# Patient Record
Sex: Male | Born: 1940 | Race: White | Hispanic: No | Marital: Married
Health system: Northeastern US, Academic
[De-identification: ages and names within clinical notes are randomized; demographics above are authoritative.]

---

## 2017-01-04 LAB — CMP (EXT)
ALT/SGPT (EXT): 11 U/L (ref 10–55)
AST/SGOT (EXT): 13 U/L (ref 10–40)
Albumin (EXT): 4.7 g/dL (ref 3.3–5.0)
Alkaline Phosphatase (EXT): 51 U/L (ref 30–100)
Anion Gap (EXT): 11 mmol/L (ref 3–17)
BUN (EXT): 19 mg/dL (ref 8–25)
Bilirubin, Total (EXT): 1.1 mg/dL — ABNORMAL HIGH (ref 0.0–1.0)
CO2 (EXT): 25 mmol/L (ref 23–32)
CalciumCalcium (EXT): 9.7 mg/dL (ref 8.5–10.5)
Chloride (EXT): 100 mmol/L (ref 98–108)
Creatinine (EXT): 1.13 mg/dL (ref 0.60–1.50)
GFR Estimated (Calc) (EXT): 63 mL/min/{1.73_m2} (ref >59)
Globulin (EXT): 1.9 g/dL (ref 1.9–4.1)
Glucose (EXT): 106 mg/dL (ref 70–110)
Potassium (EXT): 4.8 mmol/L (ref 3.4–5.0)
Protein (EXT): 6.6 g/dL (ref 6.0–8.3)
Sodium (EXT): 136 mmol/L (ref 135–145)

## 2020-11-19 LAB — CMP (EXT)
ALT/SGPT (EXT): 10 U/L (ref <42)
AST/SGOT (EXT): 15 U/L (ref <41)
Albumin (EXT): 5.1 g/dL (ref 3.5–5.2)
Alkaline Phosphatase (EXT): 58 U/L (ref 40–129)
Anion Gap (EXT): 11 mmol/L (ref 7–17)
BUN (EXT): 16 mg/dL (ref 6–23)
Bilirubin, Total (EXT): 0.8 mg/dL (ref 0.2–1.2)
CO2 (EXT): 25 mmol/L (ref 22–31)
CalciumCalcium (EXT): 9.8 mg/dL (ref 8.8–10.7)
Chloride (EXT): 103 mmol/L (ref 98–107)
Creatinine (EXT): 1.23 mg/dL — ABNORMAL HIGH (ref 0.50–1.20)
GFR Estimated (Calc) (EXT): 59 mL/min/{1.73_m2} — ABNORMAL LOW (ref >59)
Globulin (EXT): 1.4 g/dL — ABNORMAL LOW (ref 2.3–4.2)
Glucose (EXT): 97 mg/dL (ref 70–100)
Potassium (EXT): 4.4 mmol/L (ref 3.4–5.1)
Protein (EXT): 6.5 g/dL (ref 6.4–8.3)
Sodium (EXT): 139 mmol/L (ref 136–145)

## 2021-02-08 LAB — CMP (EXT)
ALT/SGPT (EXT): 12 U/L (ref <42)
AST/SGOT (EXT): 17 U/L (ref <41)
Albumin (EXT): 4.7 g/dL (ref 3.5–5.2)
Alkaline Phosphatase (EXT): 70 U/L (ref 40–129)
Anion Gap (EXT): 10 mmol/L (ref 7–17)
BUN (EXT): 25 mg/dL — ABNORMAL HIGH (ref 6–23)
Bilirubin, Total (EXT): 0.7 mg/dL (ref 0.2–1.2)
CO2 (EXT): 26 mmol/L (ref 22–31)
CalciumCalcium (EXT): 10 mg/dL (ref 8.8–10.7)
Chloride (EXT): 103 mmol/L (ref 98–107)
Creatinine (EXT): 1.46 mg/dL — ABNORMAL HIGH (ref 0.50–1.20)
GFR Estimated (Calc) (EXT): 48 mL/min/{1.73_m2} — ABNORMAL LOW (ref >59)
Globulin (EXT): 1.5 g/dL — ABNORMAL LOW (ref 2.3–4.2)
Glucose (EXT): 101 mg/dL — ABNORMAL HIGH (ref 70–100)
Potassium (EXT): 4.2 mmol/L (ref 3.4–5.1)
Protein (EXT): 6.2 g/dL — ABNORMAL LOW (ref 6.4–8.3)
Sodium (EXT): 139 mmol/L (ref 136–145)

## 2021-03-20 IMAGING — MR MRI LUMBAR SPINE WITHOUT CONTRAST
4 of 6 series · 19 of 48 positions shown · IV contrast (gadolinium)
Comparison: None

________________________________________________________________________________________________ 
MRI LUMBAR SPINE WITHOUT CONTRAST, 03/20/2021 [DATE]: 
CLINICAL INDICATION: Fell one month ago, history of L1 vertebral fracture, low 
back pain
TECHNIQUE: Sagittal T1, Sagittal T2, Sagittal STIR, Axial T1 and Axial T2 MR 
images of the lumbar spine were performed without intravenous gadolinium 
enhancement.

[Series 101: survey · axial · 10.0mm · 1.39mm/px · z∈[-33,+201]mm · 6 of 10 slices shown]
[im 1/10]
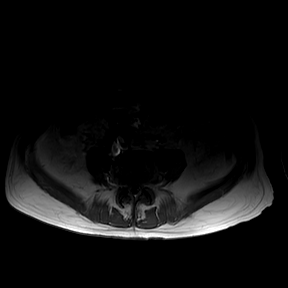
[im 2/10]
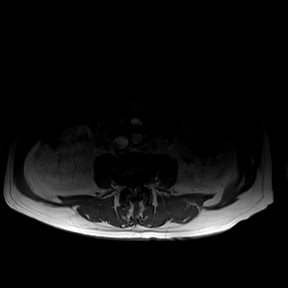
[im 4/10]
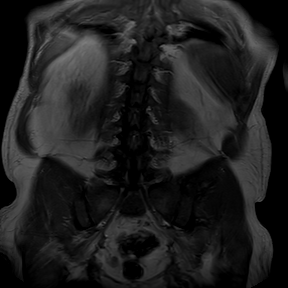
[im 6/10]
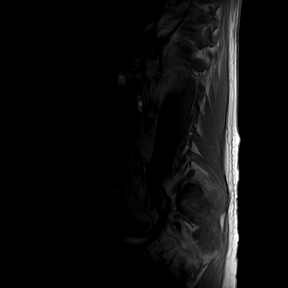
[im 8/10]
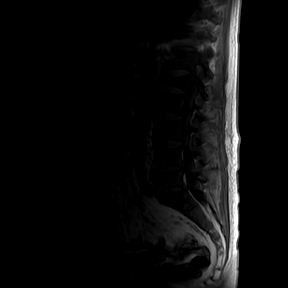
[im 10/10]
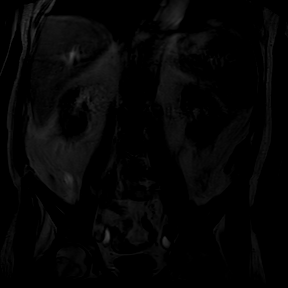

[Series 201: t2w_cor-surv · coronal · 6.0mm · 0.60mm/px · 3 of 5 slices shown]
[im 1/5]
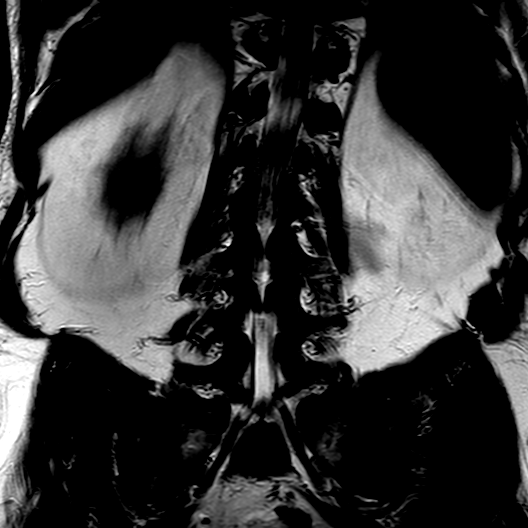
[im 3/5]
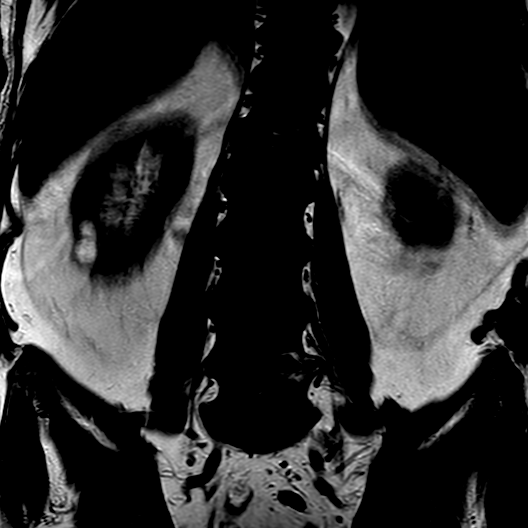
[im 5/5]
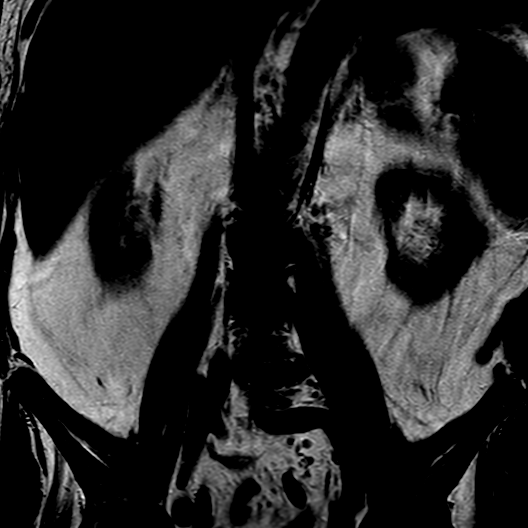

[Series 301: t1_tse_sag · sagittal · 4.0mm · 0.48mm/px · 7 of 17 slices shown]
[im 1/17]
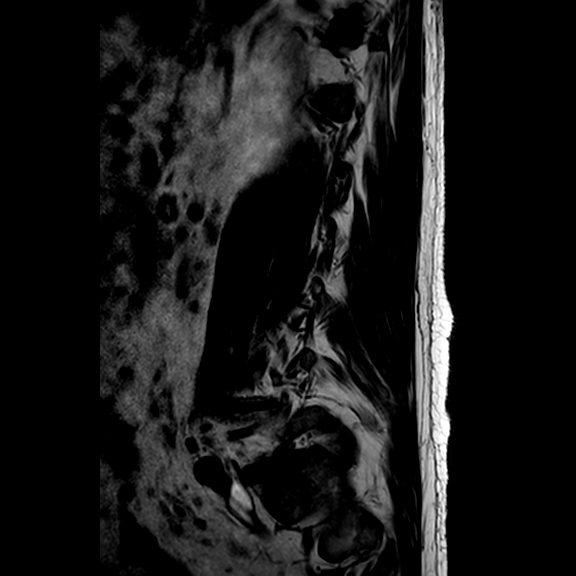
[im 3/17]
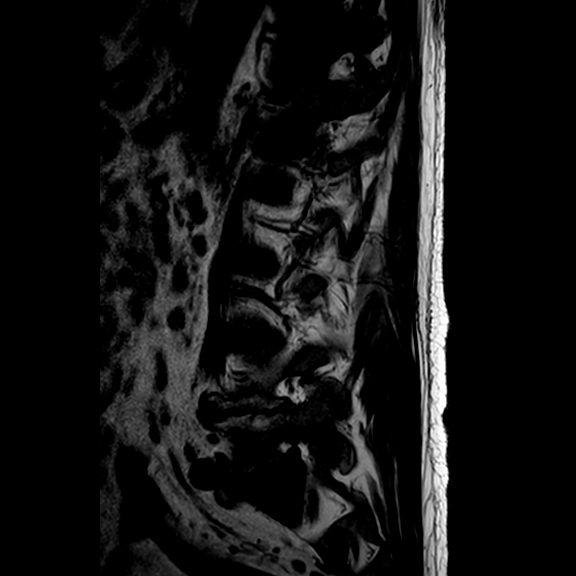
[im 5/17]
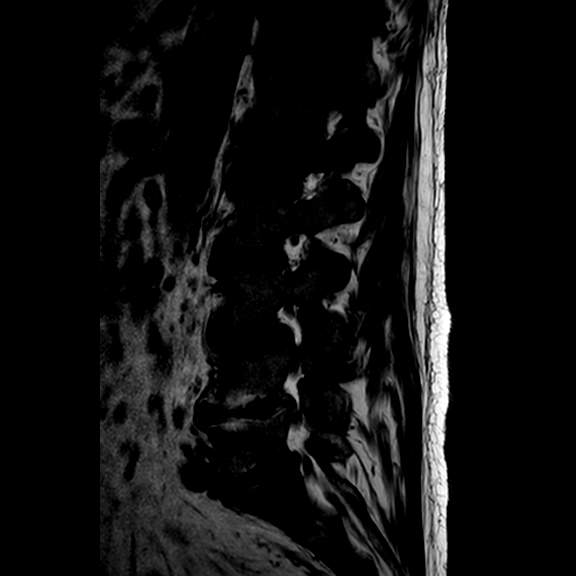
[im 7/17]
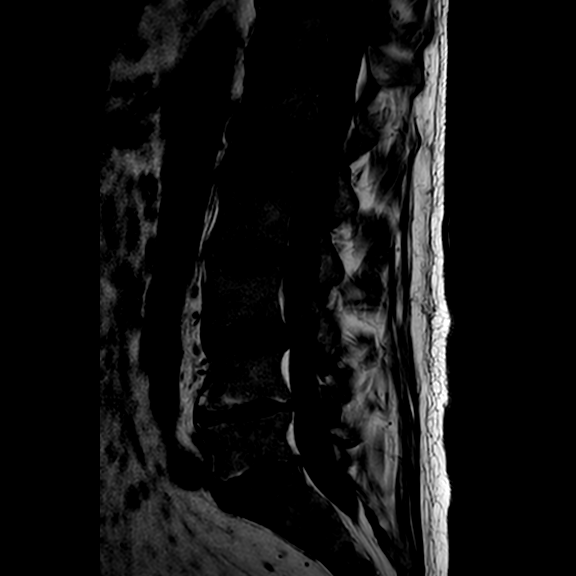
[im 10/17]
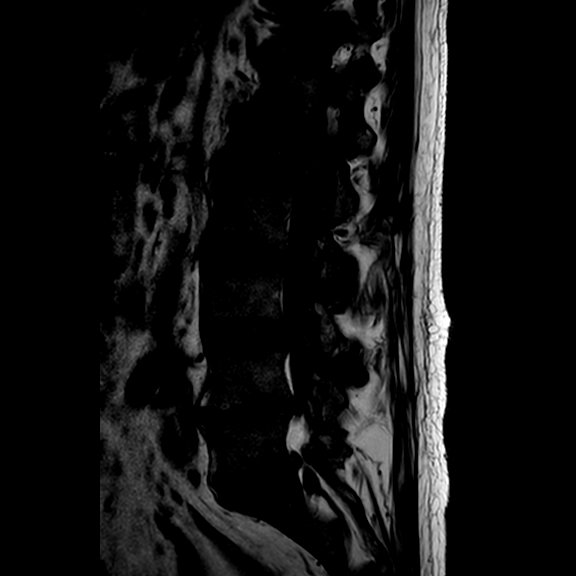
[im 12/17]
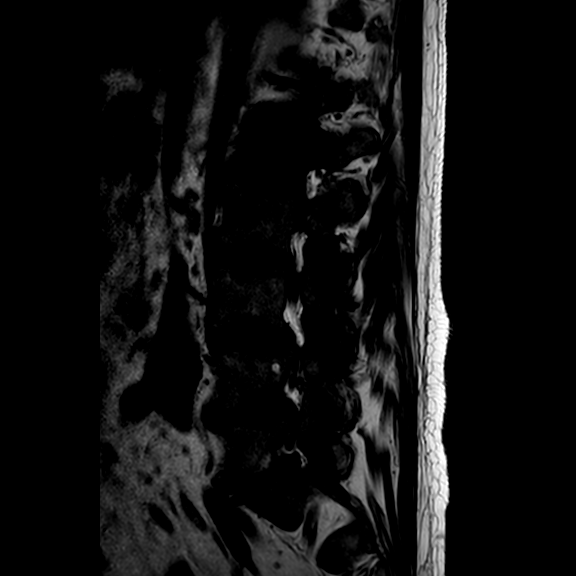
[im 14/17]
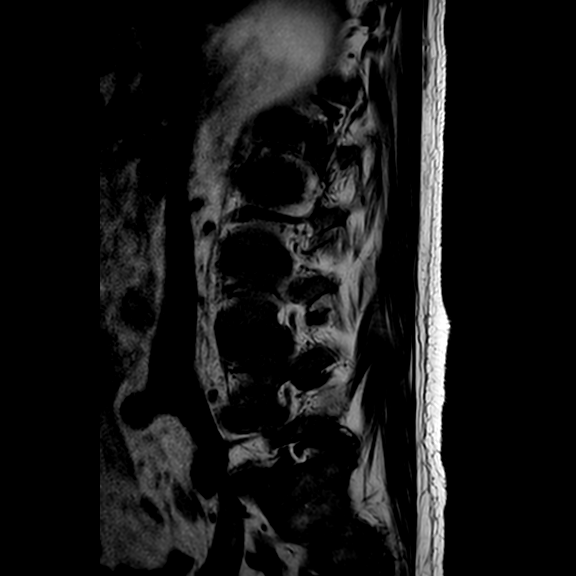

[Series 401: t2_tse_sag · sagittal · 4.0mm · 0.43mm/px · 3 of 17 slices shown]
[im 3/17]
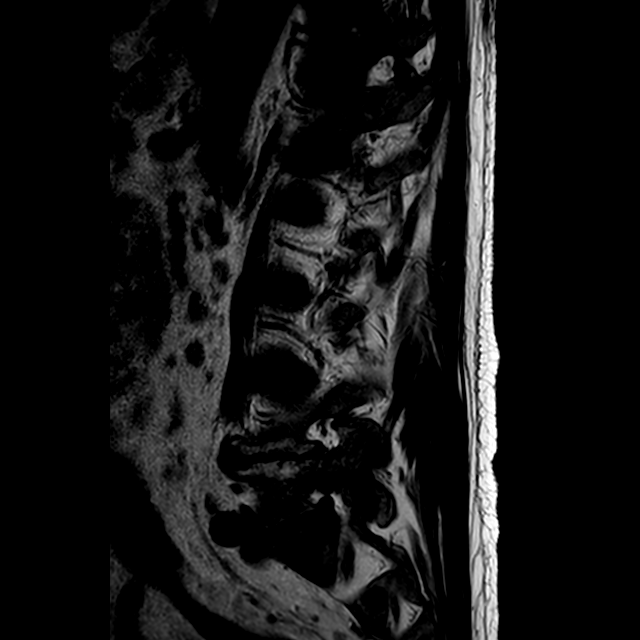
[im 10/17]
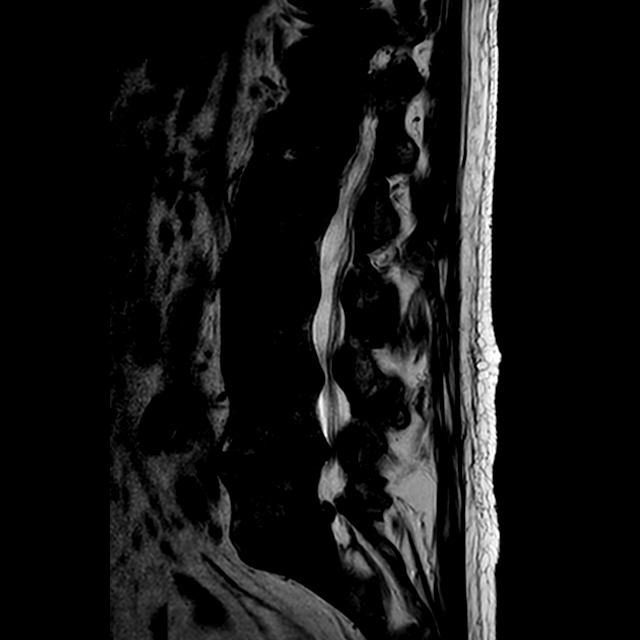
[im 14/17]
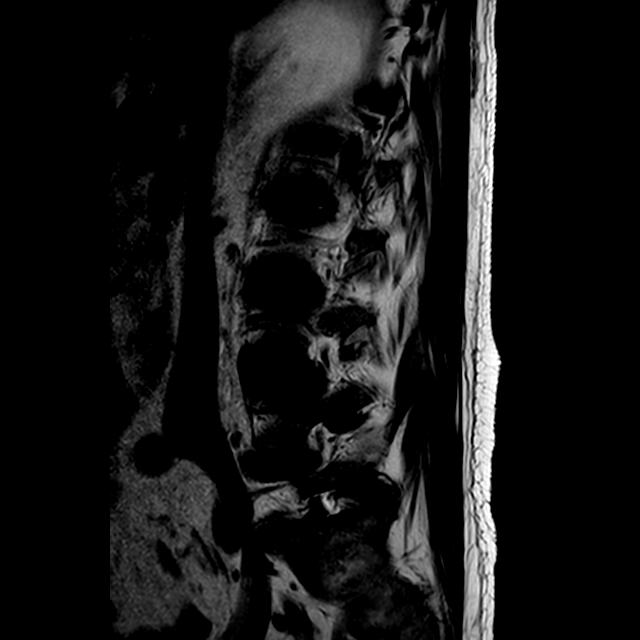

[19 of 48 positions shown; findings below may reference images not displayed]

FINDINGS: There is a moderate compression fracture of L1 with approximately 60% 
loss of height. There is mild retropulsion along the posterosuperior endplate 
indenting the thecal sac but not touching the conus tip. There appears to be 
mild residual edema suggesting relatively recent but nonacute onset, likely 1-2 
months in age. Correlation with onset of dictation. Advise. 
At L5-S1 there is marked narrowing along the posterior disc interspace. The 
canal is open. There is mild bilateral foraminal stenosis and moderate facet 
degenerative change. 
At L4-5 there is broad-based disc bulge and moderate facet change with 
ligamentous thickening contributes to mild canal stenosis. Foramina are open. 
At L3-4 there is 2 mm anterolisthesis. There is moderate canal stenosis. 
Moderate marked facet change with ligamentous thickening. Foramina are open. 
At L2-3 there is moderate to marked facet degenerative change. There is a 
synovial cyst on the left limited by the ligamentum flavum, 10 x 3 mm, axial T2 
image 18. This mildly deforms the left dorsal thecal sac, producing 
borderline-mild left-sided canal stenosis. Foramina are open. 
At L1-2 the canal and foramina are open. 
Conus terminates opposite T12-L1. There is mild lumbar dextrocurvature. There 
appear to be small right lower pole renal cysts, not well evaluated. Ultrasound 
would be useful to confirm the benign nature of these findings.
IMPRESSION: Relatively recent but nonacute moderate compression fracture of L1 as described. 
Degenerative changes elsewhere. There is moderate canal stenosis at L3-4, where 
there is 2 mm degenerative anterolisthesis. 
Left-sided synovial cyst at L2-3 mildly effaces the left dorsal thecal sac, 
producing borderline-mild left-sided canal stenosis.  
Right renal cysts are not completely evaluated. Renal ultrasound would be useful 
to confirm the benign nature of these findings.

## 2021-06-28 LAB — CMP (EXT)
ALT/SGPT (EXT): 9 U/L (ref <42)
AST/SGOT (EXT): 13 U/L (ref <41)
Albumin (EXT): 4.7 g/dL (ref 3.5–5.2)
Alkaline Phosphatase (EXT): 82 U/L (ref 40–129)
Anion Gap (EXT): 11 mmol/L (ref 7–17)
BUN (EXT): 23 mg/dL (ref 6–23)
Bilirubin, Total (EXT): 0.6 mg/dL (ref 0.2–1.2)
CO2 (EXT): 24 mmol/L (ref 22–31)
CalciumCalcium (EXT): 9.6 mg/dL (ref 8.8–10.7)
Chloride (EXT): 105 mmol/L (ref 98–107)
Creatinine (EXT): 1.23 mg/dL — ABNORMAL HIGH (ref 0.50–1.20)
GFR Estimated (Calc) (EXT): 59 mL/min/{1.73_m2} — ABNORMAL LOW (ref >59)
Globulin (EXT): 1.5 g/dL — ABNORMAL LOW (ref 2.3–4.2)
Glucose (EXT): 104 mg/dL — ABNORMAL HIGH (ref 70–100)
Potassium (EXT): 4.4 mmol/L (ref 3.4–5.1)
Protein (EXT): 6.2 g/dL — ABNORMAL LOW (ref 6.4–8.3)
Sodium (EXT): 140 mmol/L (ref 136–145)

## 2021-09-07 LAB — CMP (EXT)
ALT/SGPT (EXT): 9 U/L (ref <42)
AST/SGOT (EXT): 13 U/L (ref <41)
Albumin (EXT): 4.5 g/dL (ref 3.5–5.2)
Alkaline Phosphatase (EXT): 93 U/L (ref 40–129)
Anion Gap (EXT): 13 mmol/L (ref 7–17)
BUN (EXT): 17 mg/dL (ref 6–23)
Bilirubin, Total (EXT): 0.5 mg/dL (ref 0.2–1.2)
CO2 (EXT): 22 mmol/L (ref 22–31)
CalciumCalcium (EXT): 10 mg/dL (ref 8.8–10.7)
Chloride (EXT): 105 mmol/L (ref 98–107)
Creatinine (EXT): 1.31 mg/dL — ABNORMAL HIGH (ref 0.50–1.20)
GFR Estimated (Calc) (EXT): 55 mL/min/{1.73_m2} — ABNORMAL LOW (ref >59)
Globulin (EXT): 1.6 g/dL — ABNORMAL LOW (ref 2.3–4.2)
Glucose (EXT): 153 mg/dL — ABNORMAL HIGH (ref 70–100)
Potassium (EXT): 4 mmol/L (ref 3.4–5.1)
Protein (EXT): 6.1 g/dL — ABNORMAL LOW (ref 6.4–8.3)
Sodium (EXT): 140 mmol/L (ref 136–145)

## 2021-11-22 LAB — CMP (EXT)
ALT/SGPT (EXT): 8 U/L (ref <42)
AST/SGOT (EXT): 14 U/L (ref <41)
Albumin (EXT): 4.5 g/dL (ref 3.5–5.2)
Alkaline Phosphatase (EXT): 88 U/L (ref 40–129)
Anion Gap (EXT): 9 mmol/L (ref 7–17)
BUN (EXT): 22 mg/dL (ref 6–23)
Bilirubin, Total (EXT): 0.7 mg/dL (ref 0.2–1.2)
CO2 (EXT): 27 mmol/L (ref 22–31)
CalciumCalcium (EXT): 10.3 mg/dL (ref 8.8–10.7)
Chloride (EXT): 103 mmol/L (ref 98–107)
Creatinine (EXT): 1.44 mg/dL — ABNORMAL HIGH (ref 0.50–1.20)
GFR Estimated (Calc) (EXT): 49 mL/min/{1.73_m2} — ABNORMAL LOW (ref >59)
Globulin (EXT): 1.6 g/dL — ABNORMAL LOW (ref 2.3–4.2)
Glucose (EXT): 132 mg/dL — ABNORMAL HIGH (ref 70–100)
Potassium (EXT): 4 mmol/L (ref 3.4–5.1)
Protein (EXT): 6.1 g/dL — ABNORMAL LOW (ref 6.4–8.3)
Sodium (EXT): 139 mmol/L (ref 136–145)

## 2021-12-08 LAB — CMP (EXT)
ALT/SGPT (EXT): 8 U/L (ref <42)
AST/SGOT (EXT): 16 U/L (ref <41)
Albumin (EXT): 4.5 g/dL (ref 3.5–5.2)
Alkaline Phosphatase (EXT): 89 U/L (ref 40–129)
Anion Gap (EXT): 13 mmol/L (ref 7–17)
BUN (EXT): 22 mg/dL (ref 6–23)
Bilirubin, Total (EXT): 0.7 mg/dL (ref 0.2–1.2)
CO2 (EXT): 25 mmol/L (ref 22–31)
CalciumCalcium (EXT): 10 mg/dL (ref 8.8–10.7)
Chloride (EXT): 100 mmol/L (ref 98–107)
Creatinine (EXT): 1.44 mg/dL — ABNORMAL HIGH (ref 0.50–1.20)
GFR Estimated (Calc) (EXT): 49 mL/min/{1.73_m2} — ABNORMAL LOW (ref >59)
Globulin (EXT): 1.6 g/dL — ABNORMAL LOW (ref 2.3–4.2)
Glucose (EXT): 88 mg/dL (ref 70–100)
Potassium (EXT): 4.2 mmol/L (ref 3.4–5.1)
Protein (EXT): 6.1 g/dL — ABNORMAL LOW (ref 6.4–8.3)
Sodium (EXT): 138 mmol/L (ref 136–145)

## 2021-12-31 NOTE — Progress Notes (Signed)
Is the patient appropriate for Home Care?     Homebound Status: ***  Covid status: ***  Insurance carrier: No coverage found.     Date of potential discharge: ***    Is there a next day need?     Does the patient have a PCP?     Is the patient a [facility] patient?    Does patient use Home O2    Have your cordinated with the IP care team on the patients IV?   Have your coordinated with the IP care team on the patients Drains?   Have your coordinated with the IP care team on the patients Wounds?

## 2022-01-03 ENCOUNTER — Encounter: Admit: 2022-01-03 | Discharge: 2022-01-03 | Payer: MEDICARE

## 2022-01-03 NOTE — Home Health (Signed)
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 PROCEDURE WAS PERFORMED ON 12/30/21 BY DR Judi Saa AT [facility].    PMH; LUMBAR RADICULOPATHY, PROSTATE CANCER, CLL, SVT    PATIENT IS ALERT AND ORIENTED X 4 AND LIVES IN A SINGLE FAMILY HOME WITH HIS SUPPORTIVE SPOUSE. PATIENT REPORTS PAIN 4/10 TO BACK, TAKING OXYCODONE 5MG  1 4 HRS PRN AND TYLENOL 975MG  QID FOR PAIN MANAGMENT, TEACHING DONE ON USE OF ICE, 20 MINS ON, 20 MINS OFF. SURGICAL DRESSING INTACT TO BACK, DUE TO BE REMOVED ON POD #5, 01/04/22. PATIENT REPORTS DAILY BM'S. TEACHING DONE ON RISK OF CONSTIPATION AND PREVENTION. PATIENT HAS 1+ EDEMA TO BLE'S, SN ASSITED PATIENT WITH PUTTING ON COMPRESSION STOCKINGS AND INSTRUCTED TO ELEVATE LEGS WHEN SITTING AND TO WALK THROUGHOUT THE DAY. PATIENT HAS A STAGE II TO L SIDE OF BUTTOCKS, TEACHING DONE ON OFFLOADING AND DRESSING SUPPLIES ORDERED. FU APPT WITH SURGEON SCHEDULED FOR 11/27. MEDICATIONS RECONCILED, ALL MEDS IN HOME, SPOUSE MANAGES MEDICATIONS. PATIENT REFERRED FOR SN, PT AND OT SERVICES. SN TO REMOVE SURGICAL DRESSING, TEACH AND PROVIDE WOUND CARE TO COCCYX, ASSESS PAIN AND TEACH PAIN MANAGMENT, ASSESS GI STATUS, TEACH MEDICATIONS, TEACH DISEASE MANAGMENT.

## 2022-01-03 NOTE — Home Health (Signed)
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 PROCEDURE WAS PERFORMED ON 12/30/21 BY DR Judi Saa AT [facility].    PMH; LUMBAR RADICULOPATHY, PROSTATE CANCER, CLL, SVT  PT WAS INDEP ALL MOBILITY AND ADLS ACTIVE IN THE COMMUNITY TILL RECENTLY WHEN HE DEVELOPED BACK PAIN   CLOF  PTS MOBILITY IS BELOW BASELINE DUE TO PAIN AND DECREASED ENDURANCE.  PT AMB WITH ROLLING WALKER WITH STANDBY ASSIST. PT WOULD BENEFIT FROM PT INTERVENTION FOR GAIT TRAINING PROGRESSING TO LRD, TRANSFER TRAINING, TEACHING HEP PAIN MANAGEMENT AND PRECAUTIONS  GOAL INDEP AMB WITH LRD

## 2022-01-04 ENCOUNTER — Encounter: Admit: 2022-01-04 | Discharge: 2022-01-04 | Payer: MEDICARE

## 2022-01-04 NOTE — Home Health (Signed)
Skilled services required this visit to address clinical and/or functional declines resulting from the following problems: SA/PAIN/WOUND CARE    Patient progress toward goals this visit as evidenced by (compare to prior values): WIFE /PCA PRESENT.PATIENT HAD REPORTED LOWER BACK/BUTTOCKS PAIN, TAKEN PAIN MEDS.REPOSITIONING W/H RELIEF. LSC AND NO SOB. HE HAD SOME MILD EDEMA TO ANKLE ONLY W/H MEAUSREMENT, COMPARE TO PRIOR NURSING VISIT SWELLING GONE DOWN 2CM. HE WAS ADVISED TO ELEVATE FEET/COMPRESSION SOCKS TO HELP MINIMIZE/DECREASE SWELLING. LOWER BACK SURGICAL DRESSING REMOVED, SCANT DRAINAGE, SLIGHT SWOLLEN TOP AND NO S/S INFECTION. ENCOURAGE TO ICE AGREED ON /OFF. LOTA BACK SURGICAL.BUTTOCKS WOUND SMALLS EROUS DRAINAGE/TINY EXCORIATED /PINK AND NO S/S INFECTION. WOUND CARE GIVEN PER PLAN AND TOLERATED WELL. ALL PARTIES EDUCATED S/S INFECTION AND WHEN TO ALERT PCP/SURGEON/911/VNA. ALL UNDERSTOO AND AGREED W/H TEACHING. PATIENT AND VITALS STABLE.  HE HAS F/U W/H SURGEON 11/27.    Patient continues to require the skills of a NURISNG to address the following next visit: SA/PAIN/WOUND CARE  Patient is at risk of decline in the following areas if goals not met: INFECTION/WOUN DEGRADATION/HOSPITALIZATION

## 2022-01-05 ENCOUNTER — Encounter: Admit: 2022-01-05 | Discharge: 2022-01-05 | Payer: MEDICARE

## 2022-01-05 NOTE — Home Health (Signed)
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 Procedure was performed on 12/30/21 by Dr Judi Saa at [facility]. PMHx includes: lumbar radiculopathy, prostate cancer, CLL, SVT    Patient progress toward goals this visit as evidenced by (compare to prior values): First visit since initial assessment. Focus of this session was on teaching for BLE strengthening in sitting - ankle pumps, LAQs, Hip Flex, Hip Abd - to be done as part of HEP. Recommend 10 reps each. Teaching for technique and pacing. Written/illustrated instructions left in the home. Gait training w walker adjusted to appropriate height with good effect.    Patient continues to require the skills of Physical Therapy  to address the following next visit: Progression of HEP to include standing exercises and balance when appropriate.    Patient is at risk of decline in the following areas if goals not met: falls; decline in functional mob with inability to return to PLOF requiring increased level of assistance from family/caregivers

## 2022-01-06 ENCOUNTER — Encounter: Admit: 2022-01-06 | Discharge: 2022-01-06 | Payer: MEDICARE

## 2022-01-06 ENCOUNTER — Encounter

## 2022-01-06 NOTE — Home Health (Signed)
OT INITIAL EVALUATION:    Patient is an 81 y o male s/p hospitalization 12/30/21 for a L3-L4 vertebral body cement augmentation, L3 vertebral bone biopsy, L3, L4 Laminectomy. Discharged home 12/31/21 with his spouse. Multi level house with full bathroom and bedroom on second level- stairs with railing. Spouse is home 24/7 to provide support as needed as well as supportive family nearby. Incision clean with no signs or symptoms of infection. Pain managed with ice, tylenol and oxycodone.  PMHx significant for: Lumbar radiculopathy, prostate cancer, CLL, SVT.    PLOF: Independent in home and community including driving. Per patient he has lived with low back pain for many many years.    CLOF: Patient presents below his baseline level of function after spinal surgery 1 week ago. 1. ADL status: Requires light assistance with donning LB clothing not using AE. Today OT provided teaching on correct use of reacher and sock aide with teach back. Emphasized maintaining neutral spine with any LB care. Patient reported he has showered 1 time from a standing level. OT recommended purchasing a LH sponge, installing a grab bar and washing his face prior to showering to avoid closing his eyes which could lead to loss of balance. 2. Transfer status: Supervision with all transfers due to lack of knowledge in correct body mechanics and spinal restrictions. Teaching provided with demonstration and instruction 3. Bed mobility: Demonstration and teaching provided for maneuvering and positioning in bed- needs practice. 4. Mobility RW dependent in the house. 5. BUE ROM WNL.    Assessment: Good potential for rehab. Main limitations: Generalized weakness, limited stamina, pain, thoracic kyphosis, lack of knowledge in spinal restrictions and anxiety. Patient would benefit from skilled OT intervention to maximize safety and independence and reduce risk of falls and injury to back. Patient is in agreement with POC.

## 2022-01-06 NOTE — Home Health (Signed)
SN visit for assessment and education. Patient a&ox4. Vitals WNL. Patient denies pain at time of visit, continues with oxycodone for pain management. Educated patient on medication dose, schedule, and side effects. Patient denies chest pain, no edema noted. Patient with SOB on exertion, lungs clear. Patient reports good appetite, constipation at times. Educated patient on high fiber diet and need to drink plenty of water. Educated patient on use of laxatives as needed. Surgical incision CDI, OTA, scabbed over. Left buttocks PI completely healed. No s/s of infection noted. Educated patient on s/s of infection and techniques to relieve pressure. Patient ambulates slow and steady with walker, denies recent falls. Educated patient on fall risk safety and when to call VNA/911. Patient stated understanding of teaching. Plan to continue weekly visits for skilled assessment.

## 2022-01-07 ENCOUNTER — Encounter

## 2022-01-10 ENCOUNTER — Encounter: Admit: 2022-01-10 | Discharge: 2022-01-10 | Payer: MEDICARE

## 2022-01-10 ENCOUNTER — Encounter: Admit: 2022-01-10 | Discharge: 2022-01-10

## 2022-01-10 NOTE — Home Health (Signed)
Skilled services required this visit to address clinical and/or functional declines resulting from the following problems: SA/PAIN/SURGICAL AREA ASSESMENT    Patient progress toward goals this visit as evidenced by (compare to prior values) WIFE PRESENT.: PATIENT STATED HE IS HAVING DISCOMOFRT THAN PAIN UTLIZINF PAIN MEDS /COLD THERAPY FOR RELEIF. HE IS HAVING SOME CONSTIPATION AND ADVISED TO BE CONSITENT W/H BOWEL REGIMENT AND INCREASE FIBER INTAKE. LOWER BACK SURGICAL AREA WELL HEALED/OTA AND NO S/S INFECTION. HE/WIFE EXPLAINE IF NEW S/S PRESENT OR WORSEN TO NOTIFY PCP/SURGEON/VNA FOR FURTHER EVALUATION. BOTH UNDERSTOOD AND AGREED W/H TEACHING. HE HAS RADIATION CONSULATION VIA TELEHEALTH 11/20 NAD F/U SURGEON 111/27.    NURSING FOR THE following next visit: SA/PAIN/SURGICAL AREA LOW BACK ASSESSMNET  Patient is at risk of decline in the following areas if goals not met: INFECTION/WOUND DEGRDATION/HOSPITALIZATION/FALL RISK

## 2022-01-10 NOTE — Home Health (Signed)
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 Procedure [facility] 12[facility]30[facility]21 [facility] PMHx includes: lumbar radiculopathy, prostate cancer, CLL, SVT   Patient progress toward goals this visit as evidenced by (compare to prior values): Patient feeling well today. Focus of this session was on bed mob and pain management with initiation of standing BLE strengthening exercises to be done as part of HEP. Teaching for supine<>sit using log rolling and back protection technique. Supine hamstring stretch and single knee to chest to allow patient to lie supine comfortably in preparation for future radiation treatment. Patient tolerated well and was surprised that he could lie in supine w/o increase back pain. Teaching performed for BLE strengthening in standing - heel raises, hip flex, hip abd, hip ext, squat, hamstring curls to be done as part of HEP. Recommend 10 reps each. Teaching for technique and pacing. Written/illustrated instructions available in the home. Gait training with single point cane performed w instruction for sequencing.   Patient continues to require the skills of Physical Therapy to address the following next visit: Teaching for supine stretches and balance when appropriate.   Patient is at risk of decline in the following areas if goals not met: falls; decline in functional mob with inability to return to PLOF requiring increased level of assistance from family/caregivers

## 2022-01-11 ENCOUNTER — Encounter

## 2022-01-11 ENCOUNTER — Encounter: Admit: 2022-01-11 | Discharge: 2022-01-11 | Payer: MEDICARE

## 2022-01-12 ENCOUNTER — Encounter

## 2022-01-13 ENCOUNTER — Encounter: Admit: 2022-01-13 | Discharge: 2022-01-13 | Payer: MEDICARE

## 2022-01-13 ENCOUNTER — Encounter

## 2022-01-13 NOTE — Home Health (Signed)
OT DISCHARGE SUMMARY:    Patient is an 81 y o male s/p hospitalization 12/30/21 for a L3-L4 vertebral body cement augmentation, L3 vertebral bone biopsy, L3, L4 Laminectomy. Discharged home 12/31/21 with his spouse. PMHx significant for: Lumbar radiculopathy, prostate cancer, CLL, SVT. OT referred to address spinal restrictions within the context of ADL/transfers/bed mobility and pain control alternatives to medication.    PROGRESS TOWARD GOALS: Very good as evidenced below:  1. ADL status: Independent with ADL using AE as needed including a reacher and sock aide from a seated level for dressing and a LH sponge for bathing per OT recommendation. Originally he required light assistance with donning LB clothing not using AE and supervision for showering in standing due to unsteadiness and fear of falling. 2. Transfer status: Independent with transfers to bed, toilet with arm rests and shower stall demonstrating good understanding of spinal restrictions and neutral spine. Originally he required supervision with all transfers due to lack of knowledge in correct body mechanics and spinal restrictions. 3. Bed mobility: Improved comfort now with bed mobility and positioning after practicing techniques learned by OT.     FOCUS OF OT INTERVENTION:    Teaching the basic concept of neutral spine within the context of any movements. Also addressed pain management with positioning, icing and compression. Caregiver present for sessions and demonstrated understanding as well.

## 2022-01-13 NOTE — Home Health (Signed)
SN visit for assessment and education. Patient a&ox4. Vitals WNL. Patient denies pain at time of visit, reports sciatica pain at times. Patient saw surgeon yesterday, oxycodone stopped and new order for tramadol. Educated patient on medication dose, schedule, and side effects. Patient reports good appetite, regular BMs. Educated patient on bowel regimen and need for high fiber diet to avoid constipation. Surgical incision and buttocks PI completely healed, no s/s of infection noted. Edcuated patient on s/s of infection. Patient ambulates slow and steady with cane, denies recent falls. Educated patient on fall risk safety and when to call [facility]NA/911. Patient stated understanding of teaching. Plan for 1-2 visits then discharge.

## 2022-01-14 ENCOUNTER — Encounter

## 2022-01-14 ENCOUNTER — Encounter: Admit: 2022-01-14 | Discharge: 2022-01-14 | Payer: MEDICARE

## 2022-01-14 NOTE — Home Health (Signed)
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 [facility] 12[facility]30[facility]21 [facility] PMHx includes: lumbar radiculopathy, prostate cancer, CLL, SVT   Patient progress toward goals this visit as evidenced by (compare to prior values): Patient states that his back is a little sore, he tried to sleep on his back the other night and was feeling a little sore the next morning. Teaching for need to increase time on his back slowly. Focus of this session was on gait/stair training outside the home using single point cane. Also performed teaching for HEP for standing exercises and for use of patient's newly acquired "curbi" LE portable elliptical/bike.   Patient continues to require the skills of Physical Therapy to address the following next visit: balance.   Patient is at risk of decline in the following areas if goals not met: falls; decline in functional mob with inability to return to PLOF requiring increased level of assistance from family/caregivers

## 2022-01-17 ENCOUNTER — Encounter

## 2022-01-17 ENCOUNTER — Encounter: Admit: 2022-01-17 | Discharge: 2022-01-17 | Payer: MEDICARE

## 2022-01-17 NOTE — Home Health (Signed)
SN visit for assessment and education. Patient a&ox4. Vitals WNL. Patient denies pain at time of visit. Patient denies chest pain, no edema noted. Patient reports good appetite, regular BMs. Patient ambulates slow and steady without device, dneis recent falls. Educated patient on spinal precuations and fall risk safety. Surgical incision completely healed. Patient has Oncology follow up tomorrow and pain clinic Wednesdya. Plan to continue weekly visits for skilled assessment.

## 2022-01-18 ENCOUNTER — Encounter

## 2022-01-18 ENCOUNTER — Encounter: Admit: 2022-01-18 | Discharge: 2022-01-18 | Payer: MEDICARE

## 2022-01-21 ENCOUNTER — Encounter: Admit: 2022-01-21 | Discharge: 2022-01-21

## 2022-01-21 NOTE — Home Health (Signed)
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 Procedure was performed on 12/30/21 by Dr Judi Saa at [facility]. PMHx includes: lumbar radiculopathy, prostate cancer, CLL, SVT   Patient progress toward goals this visit as evidenced by (compare to prior values): Patient had MD appointment with radition scheduled to start next week M-F @9 :45. Patient states he was able to get on/off the CAT Scan table w/o pain. He only had some discomfort lying on the table where is incision is located.   Patient states he is doing his HEP a few days a week as able and that he had performed them today with no quesitons/concerns. Focus of this session was on standing balance performing single leg standing and tandem standing. Also added exercises to improve posture to HEP.   Patient continues to require the skills of Physical Therapy to address the following next visit: balance.   Patient is at risk of decline in the following areas if goals not met: falls; decline in functional mob with inability to return to PLOF requiring increased level of assistance from family/caregivers

## 2022-01-21 NOTE — Unmapped (Signed)
I certify that this patient is confined to his/her home and needs intermittent skilled nursing care, physical therapy/ and/or speech therapy or continues to need occupational therapy.  This patient is under my care and I have authorized the services on this plan of care and will periodically review the plan. I further certify that this patient had a Face-to-Face Encounter performed on 11.3.23 with [facility]inet[facility]le Regis, [facility] that was related to the primary reason the patient requires Home Health services.

## 2022-01-21 NOTE — Unmapped (Signed)
Verbal SOC was obtained on 11.6.23.

## 2022-01-24 ENCOUNTER — Encounter: Admit: 2022-01-24 | Discharge: 2022-01-24 | Payer: MEDICARE

## 2022-01-24 NOTE — Home Health (Signed)
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 Procedure was performed on 12/30/21 by Dr Judi Saa at [facility]. PMHx includes: lumbar radiculopathy, prostate cancer, CLL, SVT   Patient progress toward goals this visit as evidenced by (compare to prior values): Patient independent with standing BLE strengthening exercises. Teaching today for balance exercises to be done as part of HEP. Performed standing dynamic balance activities - tandem walking, side stepping, braided walking, targeted alternating toe tapping on cone. Written instructions left in the home with teaching for how to progress.  Patient continues to require the skills of Physical Therapy to address the following next visit: pain management.   Patient is at risk of decline in the following areas if goals not met: falls

## 2022-01-25 ENCOUNTER — Encounter: Admit: 2022-01-25 | Discharge: 2022-01-25 | Payer: MEDICARE

## 2022-01-25 ENCOUNTER — Encounter

## 2022-01-25 NOTE — Home Health (Signed)
MEDICAL NECESSITY: s/p back surg, prostate ca      FOCUS OF SKILLED ASSESSMENT: sa, pain assess      PATIENT PROGRESS TOWARDS GOALS: patient alert and oriented x3 this visit, able to make needs known. pt reports back pain 3/10 - takes apap and tramadol with fair effect. lscta spo2 99% room air. pt denies cough/sob. pt reports good appetite. last bm today. pt denies s/sx dysuria. pt had cortisone shot to back today. incision to back dry and scabbed. no s/sx infection noted.       SKILLED TREATMENT TO BE PROVIDED NEXT VISIT: sa, pain assess      PATIENT AT RISK IF GOALS NOT MET: hospitalization

## 2022-01-28 ENCOUNTER — Encounter: Admit: 2022-01-28 | Discharge: 2022-01-28

## 2022-01-28 NOTE — Home Health (Signed)
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 Procedure [facility] 12/30/21 [facility] PMHx includes: lumbar radiculopathy, prostate cancer, CLL, SVT     Patient progress toward goals this visit as evidenced by (compare to prior values): Pts pain increased today , has inc since his injection on tuesday.  Focus of treatment pain managent  pt able to teach back  Patient continues to require the skills of Physical Therapy to address the following next visit: pain management.  wil contact pt via phone if pain is resolving will discharge    Patient is at risk of decline in the following areas if goals not met: falls

## 2022-02-01 ENCOUNTER — Encounter

## 2022-02-02 ENCOUNTER — Encounter

## 2022-02-03 ENCOUNTER — Encounter: Admit: 2022-02-03 | Discharge: 2022-02-03 | Payer: MEDICARE

## 2022-02-04 ENCOUNTER — Encounter: Admit: 2022-02-04 | Discharge: 2022-02-04 | Payer: MEDICARE

## 2022-02-04 NOTE — Home Health (Signed)
SN visit for assessment and education. Patient a&ox4. Vitals WNL. Patient denies pain at time of visit, patient reports lower back pain at times. Patient taking oxycodone with good effect. Educated patient on medication including dose, schedule, and side effects. Patient reports he has new compression fx to lumbar, follow up with neuro in late December. Educated patient on spinal precautions. Patient denies chest pain, no edema noted. Patient with SOB on exertion, lungs clear. Patient reports good appetite, constipation at times. Educated patient on need for high fiber diet and use of stool softners and laxatives. Patient ambulates slow and steady with cane, denies recent falls. Educated patient on fall risk safety and when to call [facility]/911. Patient stated understanding of teaching. Patient continues with radiation Monday to Friday. Plan to continue weekly visits for skilled assessment.

## 2022-02-08 ENCOUNTER — Encounter

## 2022-02-09 ENCOUNTER — Encounter

## 2022-02-10 ENCOUNTER — Encounter: Admit: 2022-02-10 | Discharge: 2022-02-10 | Payer: MEDICARE

## 2022-02-10 ENCOUNTER — Encounter

## 2022-02-11 ENCOUNTER — Encounter: Admit: 2022-02-11 | Discharge: 2022-02-11 | Payer: MEDICARE

## 2022-02-14 ENCOUNTER — Encounter: Admit: 2022-02-14 | Discharge: 2022-02-14 | Payer: MEDICARE

## 2022-02-14 NOTE — Home Health (Signed)
[facility].

## 2022-02-15 ENCOUNTER — Encounter

## 2022-02-16 ENCOUNTER — Encounter

## 2022-02-16 NOTE — Progress Notes (Signed)
Is the patient appropriate for Home Care?     Homebound Status: ***  Covid status: ***  Insurance carrier: Payor: [employer] / Plan: UNICARE MEDICARE SUPPLEMENT / Product Type: *No Product type* /     Date of potential discharge: ***    Is there a next day need?     Does the patient have a PCP?     Is the patient a [facility] patient?    Does patient use Home O2    Have your cordinated with the IP care team on the patients IV?   Have your coordinated with the IP care team on the patients Drains?   Have your coordinated with the IP care team on the patients Wounds?

## 2022-02-19 ENCOUNTER — Encounter

## 2022-02-19 ENCOUNTER — Encounter: Admit: 2022-02-19 | Discharge: 2022-02-19 | Payer: MEDICARE

## 2022-02-19 NOTE — Home Health (Signed)
Patient age/gender/living situation/Primary Dx for homecare/Reason for Admission: 81 y.o. male lives with spouse in their own condo. Recent hospitalzation for worsening pain in his back and abdomen and dx with compression fracture T-10 and T-11 with surgical intervention, new incision covered wtih DPD for one week.  Denies any fall or injury.     Current Problems requiring skilled service to be addressed by above interventions (clinical, mental, adl and functional mobility): SN required for SA pain, incision, GI, hydration. Patient reports current pain level is 4-5 depending on position and activity. Takes tylenol 1000 mg every 8hr with fair effect, took tramadol last night. "Approaching normal' re bowel movements, had developed diarrhea in the hospital. Appetite fair, "not good", has lost about 20-24lb in the past year.   Patient saw surgical PA yesterday.     Psychosocial and cognitive issues identified which may impact plan of care:none     Any additional services requested based on risk assessment? PT     Homebound Status:patient is homebound due to decreased strength and endurance, pain with mobitlity, requires assist of one to leave home for medical appointments.     Patient Identified care representative: spouse     [facility]   Patient YES   Caregiver/representative YES      Call to MD to confirm  Medications & Orders Obtained (who you spoke with): Unable to reach on Saturday, Monday is Christmas and patient has  pcp tues.WIll call Tuesday 12/26.

## 2022-02-20 ENCOUNTER — Encounter: Admit: 2022-02-20 | Discharge: 2022-02-20 | Payer: MEDICARE

## 2022-02-20 NOTE — Home Health (Signed)
Patient age/gender/living situation/Primary Dx for homecare/Reason for Admission: This patient is an 81 year-old man who [facility]/p T10-11 kyphoplaty. Discharged back to home with spouse to 2 story condo with 3 STE and 14 steps to second floor.     Current Problems requiring skilled service to be addressed by above interventions (clinical, mental, adl and functional mobility): back pain, decreased positional tolerance, transfer, balance and gait quality    Psychosocial and cognitive issues identified which may impact plan of care: none noted    Any additional services requested based on risk assessment? No additional services needed    Homebound Status:back pain, decreased positional tolerance, transfer, balance and gait quality    Patient Identified care representative: Debbe OdeaJudith Grant, wife    [facility]:  Patient Yes  Caregiver/representative Yes       Call to MD(name) Willeen CassFarah Mullah to obtain verbal orders fro PT POC Obtained (who you spoke with): Left VM with request for call back when office re-opens.

## 2022-02-22 ENCOUNTER — Encounter

## 2022-02-23 ENCOUNTER — Encounter

## 2022-02-24 ENCOUNTER — Encounter

## 2022-02-25 ENCOUNTER — Encounter

## 2022-02-25 ENCOUNTER — Encounter: Admit: 2022-02-25 | Discharge: 2022-02-25 | Payer: MEDICARE

## 2022-02-27 NOTE — Progress Notes (Signed)
Is the patient appropriate for Home Care?     Homebound Status: ***  Covid status: ***  Insurance carrier: Payor: UNICARE / Plan: UNICARE MEDICARE SUPPLEMENT / Product Type: *No Product type* /     Date of potential discharge: ***    Is there a next day need?     Does the patient have a PCP?     Is the patient a [facility] patient?    Does patient use Home O2    Have your cordinated with the IP care team on the patients IV?   Have your coordinated with the IP care team on the patients Drains?   Have your coordinated with the IP care team on the patients Wounds?

## 2022-02-27 NOTE — Home Health (Signed)
patient admitted to [facility] 02/24/22 Dx spinal stenosis, T11 compression fracture

## 2022-02-28 ENCOUNTER — Encounter

## 2022-03-01 ENCOUNTER — Encounter

## 2022-03-01 NOTE — Telephone Encounter (Signed)
Called [phone] and LVM   Attempting to schedule consult for NP/Palliative

## 2022-03-01 NOTE — Telephone Encounter (Signed)
Dhruva called me back and scheduled to see NP on 01/05 at 12pm

## 2022-03-02 ENCOUNTER — Encounter

## 2022-03-02 LAB — CMP (EXT)
ALT/SGPT (EXT): 5 U/L (ref <42)
AST/SGOT (EXT): 11 U/L (ref <41)
Albumin (EXT): 4.4 g/dL (ref 3.5–5.2)
Alkaline Phosphatase (EXT): 96 U/L (ref 40–129)
Anion Gap (EXT): 11 mmol/L (ref 7–17)
BUN (EXT): 17 mg/dL (ref 6–23)
Bilirubin, Total (EXT): 0.5 mg/dL (ref 0.2–1.2)
CO2 (EXT): 25 mmol/L (ref 22–31)
CalciumCalcium (EXT): 9.5 mg/dL (ref 8.8–10.7)
Chloride (EXT): 99 mmol/L (ref 98–107)
Creatinine (EXT): 1.07 mg/dL (ref 0.50–1.20)
GFR Estimated (Calc) (EXT): 70 mL/min/{1.73_m2} (ref >59)
Globulin (EXT): 1.7 g/dL — ABNORMAL LOW (ref 2.3–4.2)
Glucose (EXT): 104 mg/dL — ABNORMAL HIGH (ref 70–100)
Potassium (EXT): 4 mmol/L (ref 3.4–5.1)
Protein (EXT): 6.1 g/dL — ABNORMAL LOW (ref 6.4–8.3)
Sodium (EXT): 135 mmol/L — ABNORMAL LOW (ref 136–145)

## 2022-03-02 NOTE — Telephone Encounter (Signed)
Called [phone] and LVM to confirm Fridays appt 01/05 at 10am or 11am earlier than originally scheduled

## 2022-03-02 NOTE — Telephone Encounter (Signed)
Verbal order for delay in [facility] to 03/04/22 d/t PCP appt tomorrow-order obtained

## 2022-03-02 NOTE — Telephone Encounter (Signed)
Prescreen phone call made to patient who states they are not active with any other agencies, no pets and not driving. COVID screening negative. Address/phone number/PCP confirmed. Patient has appt with PCP on 1/4, requesting Friday afternoon visit.

## 2022-03-03 ENCOUNTER — Encounter

## 2022-03-03 NOTE — Telephone Encounter (Addendum)
Call from [place] at PCP office, PCP will sign orders for SN services but feel any PT orders need to come from his ortho. Matthew Werner called back to update that Neuro for any PT orders call dr Rich Reining

## 2022-03-03 NOTE — Telephone Encounter (Signed)
Matthew Werner called me back and unable to move appt for earlier since [facility] attends to radiation M-F in the mornings.   All appts will have to be after 12   Confirmed appt for tomorrow at 12 (01/05)

## 2022-03-04 ENCOUNTER — Encounter: Admit: 2022-03-04 | Discharge: 2022-03-04 | Payer: MEDICARE

## 2022-03-04 ENCOUNTER — Encounter

## 2022-03-04 ENCOUNTER — Ambulatory Visit: Admit: 2022-03-04 | Payer: MEDICARE | Attending: Adult Health

## 2022-03-04 DIAGNOSIS — Z7189 Other specified counseling: Secondary | ICD-10-CM

## 2022-03-04 MED ORDER — diazePAM (Valium) 2 mg tablet
2 | ORAL_TABLET | Freq: Every day | ORAL | 0 refills | 30.00000 days | Status: AC | PRN
Start: 2022-03-04 — End: ?

## 2022-03-04 MED ORDER — naloxone (Narcan) 4 mg/0.1 mL nasal spray
4 | NASAL | 0 refills | Status: AC | PRN
Start: 2022-03-04 — End: ?

## 2022-03-04 MED ORDER — oxyCODONE ER (OxyCONTIN) 10 mg 12 hr tablet
10 | ORAL_TABLET | Freq: Two times a day (BID) | ORAL | 0 refills | 8.00000 days | Status: DC
Start: 2022-03-04 — End: 2022-03-07

## 2022-03-04 NOTE — Home Health (Signed)
SN visit for ROC/Recert. 82yo male with PMH of spinal compression fractures s/p laminectomy and kyphoplasty recently admitted to [facility] for lower back pain. Patient underwent CT scan and MRI which was unremarkable for new diagnosis. Patient's pain medications switched during admission and patient discharged home where he lives with his wife in a townhouse. SN services needed for assessment and education following multiple hospitalizations, pain management, and assessment and education following surgery.    Patient a&ox4. Vitals WNL. Patient denies pain at time of visit, taking oxycodone and timed released oxycontin with good effect. Educated patient on medications including dose, schedule, and side effects. Educated patient on pain mangeemnt techniques. Patient denies chest pain, no edema noted. Lungs clear. Patient reports good appetite, regular BMs. Educated patient on need for adequate nutrition and hydration. Surgical incisions completely healed. Patient ambulates slow and steady with cane, denies recent falls. Educated patient on fall risk safety.     Med rec completed, no issues. Educated patient when to call [facility]NA/911. Patient stated understanding of teaching.

## 2022-03-04 NOTE — Progress Notes (Signed)
[facility] AT [facility]  [facility]  [address]  [place]  [place] [address]  Dept: [phone]  Dept Fax: [phone]     Chief Complaint/reason for visit: Exploration of Treatment Options, CLL, Prostate CA, Pain, Functional Decline, Edema, Weight loss, Adjustment Depression, Anxiety    Place of service: Home, spouse Darel Hong present     Type of visit (new/established): Initial     HPI: This is 82 y.o. 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 Robert Bellow with LBP improvement but with persistent right thigh pain. Then with increasing difficulty lying flat for XRT for prostate CA and increasing pain, found more compression and underwent T10-T11 kyphoplasty/biopsy with Dr Bartholomew Boards on 12/20. 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 Then returned to [facility] 2 days later on Christmas Day with severe debilitating thoracolumbar pain below area of T10/T11 kyphoplasty. He was talking Tylenol and Oxycodone without relief, as well as, constipated with pain and bleeding with defecation. Ultimately, found no longer to be a surgical candidate again, given pain regime of Oxycontin 10mg  BID with Oxycodone 10mg  q6h for breakthrough, Gabapentin 100mg  BID, continue with Tylenol PRN, as well as, bowel regime. Also with recommendations to follow up outpatient with Dr. Bartholomew Boards, VNA referral for services for SN, PT and palliative care for symptom management.     At time of this visit, patient lives at townhouse with wife Darel Hong who is present for visit. They just met with VNA and patient will  start VNA services with nursing and awaiting PT referral. Patient and wife reports improved pain levels, mobility, sleeping in bed again with long acting Oxycontin 10mg  BID currently not taking or requiring Oxycodone 10mg  q6h PRN for breakthrough. He also continues with Gabapentin 100mg  BID and Tylenol 1000mg  BID PRN. They are thrilled with the improvement in pain levels and improved QOL. During visit reports 4/10 continuous achy bandlike pain across lower thoracic and lumbar spine without radiculopathy or radiation. Denies h/a, neck pain, paresthesias. Bowels are also improved with regime of Miralax BID and Senna 1 tab nightly with last BM today and going daily. Denies melena. He reports Oxycontin will run out on Tuesday and does not have any providers currently to fill Rx. Follow up apt with Dr. Bartholomew Boards has not been established yet. He saw PCP yesterday with PCP to address recent anemia (hgb 8.8 on 1/3), start of Prolia 2x/year with hem/onc Dr. Laveda Norman who follows patient for CLL (blood test surveillance) and Lupron injections. Due to see Dr. Laveda Norman again in 3 months but will be following up on anemia. He is going to [facility] in [place] for XRT just completing 17 out of 28 sessions today meeting with radiation oncologist weekly. He does require Valium 2mg  prior to XRT to be able to lay flat. Reports tolerating XRT well with no concerns at this time.  Plan is to eradicate Prostate CA with XRT and he is not a surgical candidate. He also follows with urologist Dr. Esmond Camper and continues on Alfuzosin treating urinary retention with BPH with issues of urinating q2h day and night. He also with LE edema which resolves  with elevation and after sleeping in bed at night and looking to use compression stockings again. Patient with complicated and traumatic last year is tearful, overwhlemed and depressed at times discussing medical and personal stressors. Wife feeling he is tearful daily with overwhelming adjustment depression and anxiety  to new medical problems which is all new for patient in recent months. They have cancelled their trip to [place] for the Winter due to medical conditions.     HCP primary is wife Darel Hong and alternate daughter Maxine Glenn in place. Patient reports he has filled out multiple MOLSTs during hospitalizations that reflect DNR/DNI wishes but does not have a copy in the home. I gave him a MOLST that we can fill out in the home at a future visit.      Past Medical History:   Diagnosis Date   . Back pain 03/04/2022   . Benign prostatic hyperplasia 09/12/2011   . Cancer treatment induced bone loss 01/18/2022   . CLL (chronic lymphoid leukemia) in relapse (CMS-HCC) (HHS-HCC) 01/07/2013    Formatting of this note might be different from the original.    Last Assessment & Plan:  Formatting of this note might be different from the original. No disease-related cytopenias. Exam without adenopathy. No symptoms to suggest progression of his disease.  He presents today feeling well without indications for therapy. I recommended continued observation.  At this time the patient prefers to be   . Compression fracture of thoracic vertebra (CMS-HCC) (HHS-HCC) 03/04/2022   . COVID-19    . Diverticulitis of colon 08/27/2009   . Hyperlipidemia 03/04/2022   . Lumbar radiculopathy 03/04/2022   . Osteoarthritis    . Prostate cancer (CMS-HCC) (HHS-HCC) 11/22/2021   . Spinal stenosis 03/04/2022   . Supraventricular tachycardia          Past Surgical History:   Procedure Laterality Date   . BLADDER SUSPENSION     . KNEE ARTHROSCOPY W/ DEBRIDEMENT Right    . KYPHOSIS SURGERY      03/2021, 01/2022   . LUMBAR LAMINECTOMY  12/2021    L3-4        Family History   Problem Relation Name Age of Onset   . Colon cancer Brother          Social History     Socioeconomic History   . Marital status: Married     Spouse name: Darel Hong   . Number of children: 3   . Years of education: None   . Highest education level: None   Occupational History   . None   Tobacco Use   .  Smoking status: Former     Types: Cigarettes   . Smokeless tobacco: Never   Substance and Sexual Activity   . Alcohol use: Not Currently   . Drug use: Never   . Sexual activity: None   Other Topics Concern   . None   Social History Narrative    Married lives with wife Darel Hong    Three daughters, 9 grandchildren    Retired Acupuncturist    Former Smoker      Social Determinants of Health     Financial Resource Strain: Not on file   Food Insecurity: Not on file   Transportation Needs: No Transportation Needs (03/04/2022)    OASIS A1250: Transportation    . Lack of Transportation (Medical): No    . Lack of Transportation (Non-Medical): No    . Patient Unable or Declines to Respond: No   Physical  Activity: Not on file   Stress: Not on file   Social Connections: Feeling Socially Integrated (03/04/2022)    OASIS D0700: Social Isolation    . Frequency of experiencing loneliness or isolation: Never   Recent Concern: Social Connections - Feeling Somewhat Isolated (01/03/2022)    OASIS D0700: Social Isolation    . Frequency of experiencing loneliness or isolation: Sometimes   Intimate Partner Violence: Not on file   Housing Stability: Not on file        Immunization History   Administered Date(s) Administered   . Influenza quadrivalent, ADJUVANTED, 2023-2024 high risk 11/09/2020   . Pneumococcal Conjugate PCV 13 08/04/2015   . Pneumococcal Polysaccharide PPV23 08/09/2016   . Zoster, live 06/29/2017         Allergies   Allergen Reactions   . Penicillins Rash     Other reaction(s): Not available         Current Outpatient Medications   Medication Sig Dispense Refill   . acetaminophen (Tylenol) 500 mg tablet Take by mouth every 6 (six) hours if needed. 2 tabs BID PRN      . alfuzosin (Uroxatral) 10 mg 24 hr tablet Take 10 mg by mouth at noon and 10 mg in the evening. Indications: enlarged prostate with urination problem     . bicalutamide (Casodex) 50 mg chemo tablet Take 50 mg by mouth once daily. Indications: advanced form of  prostate cancer     . cholecalciferol, vitamin D3, (Vitamin D-3) 25 MCG (1000 UT) tablet Take 25 mcg by mouth once daily.     Marland Kitchen DILTIAZEM HCL ORAL Take 120 mg by mouth 1 (one) time each day. Indications: .     . docusate sodium (Colace) 100 mg tablet Take 100 mg by mouth if needed in the morning and at bedtime for constipation. Indications: constipation     . omeprazole (PriLOSEC) 20 mg DR capsule Take 20 mg by mouth before breakfast. Do not crush or chew.     . polyethylene glycol (Glycolax) 17 gram packet Take 17 g by mouth twice daily.     . sennosides (senna) 8.6 mg tablet Take 1 tablet by mouth once daily.     Marland Kitchen ascorbic acid (Vitamin C) 500 mg tablet Take 500 mg by mouth once daily. Indications: inadequate vitamin C     . calcium carbonate 600 mg calcium (1,500 mg) tablet Take 1,500 mg by mouth once daily. Indications: osteoporosis, a condition of weak bones     . cholecalciferol (D3-5) 5,000 Units tablet Take 5,000 Units by mouth once daily. Indications: prevention of vitamin D deficiency     . diazePAM (Valium) 2 mg tablet Take 1 tablet (2 mg) by mouth if needed each day (prior to radiation procedure). 30 tablet 0   . gabapentin (Neurontin) 100 mg capsule Take 100 mg by mouth twice daily. Indications: neuropathic pain     . multivitamin tablet Take 1 tablet by mouth once daily. Indications: treatment to prevent vitamin deficiency     . naloxone (Narcan) 4 mg/0.1 mL nasal spray Administer 1 spray (4 mg) into affected nostril(s) if needed for opioid reversal. May repeat every 2-3 minutes if needed, alternating nostrils, until medical assistance becomes available. 1 each 0   . oxyCODONE (Roxicodone) 10 mg immediate release tablet Take 10 mg by mouth every 6 (six) hours if needed for pain score 4-6. Indications: pain     . oxyCODONE ER (OxyCONTIN) 10 mg 12 hr tablet Take 1 tablet (10 mg)  by mouth every 12 (twelve) hours. Do not crush, chew, or split. 60 tablet 0     No current facility-administered medications  for this visit.         ROS:  CONSTITUTION: no fever/chills, no fatigue, +daytime drowsiness-one nap per day, appetite currently 2-3 meals/day, portion sizes 50% less compared to 6 months ago, + weight loss 190lbs a year ago and now 170lbs   EYES: no changes in vision    ENT: no dysphagia, no odynophagia  CV: No CP, no palpitations, no dizziness, no lightheadedness, + LE edema (see HPI)   RESP: no cough, no sputum, reports no SOB at rest or with communication, no orthopnea, no DOE (does not exert much)   GI: +reflux managed with Prilosec, no n/v, no constipation, no diarrhea, last BM today, no abdominal pain, no bowel incontinence  GU: no urinary incontinence, no dysuria, + urinary frequency at day and night q2h, +urinary retention and BPH (see HPI)   NEURO:  no H/A's, no neuropathy, no asymmetrical weakness, + LE weakness from decreased mobility   MUSCULOSKELETAL: + joint stiffness and pain in back (see HPI)  PSYCH/COGNITION: reports + depression and anxiety (see HPI), + sleep disturbance with urination frequency, no STM loss, no confusion   MOBILITY/Functional Status/ADLs: transfers and ambulates on own, using cane with transfers, stairs and long distances when feeling unsteady   DIET: Boost with protein 2x/day, high protein diet    SKIN: no rash, no breakdown       Elder Abuse Screen: Negative     PH-Q2:   Over the past 2 weeks, how often have you been bothered by any of the following problems?  Little interest or pleasure in doing things: More than half the days  Feeling down, depressed, or hopeless: More than half the days  Patient Health Questionnaire-2 Score: 4      Advance Care Planning   HCP [facility] primary, [employer] alternate; filled out MOLST at recent hospital stays to reflect DNR/DNI wishes, left copy to be filled out a future visit as we did not get to it today     Care team:  Kennyth Lose, DO, PCP   Dr. Valetta Mole, Oncologist, managing Prolia q57mos, Lupron injections, CLL (blood tests),  anemia next apt in 3 months   Dr. Ricka Burdock, [facility], [address], [place]   Dr. Mitzie Na, Radiation Oncologist, [facility]   Dr. Sena Hitch, Neurosurgeon, [facility], 12/27, awaiting new apt   Dr. Annett Gula, Cardiologist   Dr. Lenna Sciara, Urologist     PE:  BP 128/62 (BP Location: Left arm, Patient Position: Sitting)   Pulse 71   Resp 18   Wt 77.1 kg   SpO2 97%    CONSTITUTIONAL:  sitting upright in chair in living room, tearful at times discussing medical conditions, engaged   HEENT: sclera anicteric, conjunctiva non-injected, no oral lesions  CV: RRR, no JVD, +R>L +1-2 bilateral LE edema  PULM: CTA, no accessory muscle use   GI:  +BS's, soft, NT/ND, no organomegaly    NEURO: A&Ox3, CN II-XII grossly intact, grip strength intact, UE strength 5/5, LE strength 5/5, speech fluent and clear    MUSCULOSKELETAL: tender to touch over lower thoracic/upper lumbar spine  PSYCH: mood/affect appropriate and sad at times; judgement and insight intact; memory and recall of recent medical events intact    SKIN: no rash, no breakdown    PPS: 50%     LABS:  Contains abnormal dataCBC and differential  Order: [id number]  Component   Ref Range & Units 2 d ago   WBC   4.00 - 10.00 K/uL 5.04    RBC   4.50 - 6.40 M/uL 2.86Low    HGB   13.5 - 18.0 g/dL 4.7WGN    HCT   56.2 - 54.0 % 26.5Low    PLT   150 - 450 K/uL 174    MCV   80.0 - 100.0 fL 92.7    MCH   27.0 - 32.0 pg 30.8    MCHC   32.0 - 36.0 g/dL 13.0    RDW   86.5 - 78.4 % 14.1    MPV   8.4 - 12.0 fl 9.2    NRBC   0 /100 WBCs 0.00    ABSOLUTE NRBC   0 K/uL 0.00    DIFF METHOD  Auto    NEUTS   48.0 - 76.0 % 52.1    LYMPHS   18.0 - 41.0 % 38.9    MONOS   4.0 - 11.0 % 6.0    EOS   0.0 - 5.0 % 2.0    BASOS   0.0 - 1.5 % 0.4    % IMMATURE GRANS   0.0 - 1.0 % 0.6    ABSOLUTE NEUTS   1.92 - 7.60 K/uL 2.63    ABSOLUTE LYMPHS   0.72 - 4.10 K/uL 1.96    ABSOLUTE MONOS   0.16 - 1.10 K/uL 0.30    ABSOLUTE EOS   0.00 - 0.50 K/uL 0.10    ABSOLUTE BASOS   0.00 - 0.15  K/uL 0.02    ABS IMMATURE GRANS   0.00 - 0.10 K/uL 0.03    Resulting Agency [facility]     Specimen Collected: 03/02/22 12:31    Performed by: Nyra Capes [facility] Last Resulted: 03/02/22 12:35   Received From: [facility]  Result Received: 03/02/22 13:42     Comprehensive metabolic panel  Order: [id number]  Component   Ref Range & Units 2 d ago   SODIUM   136 - 145 mmol/L 135Low    POTASSIUM   3.4 - 5.1 mmol/L 4.0    CHLORIDE   98 - 107 mmol/L 99    CO2   22 - 31 mmol/L 25    BUN   6 - 23 mg/dL 17    CREATININE   1.32 - 1.20 mg/dL 4.40    GLUCOSE   70 - 100 mg/dL 102VOZD    ALBUMIN   3.5 - 5.2 g/dL 4.4    TOTAL PROTEIN   6.4 - 8.3 g/dL 6.6YQI    CALCIUM   8.8 - 10.7 mg/dL 9.5    ALKALINE PHOSPHATASE   40 - 129 U/L 96    TOTAL BILIRUBIN   0.2 - 1.2 mg/dL 0.5    AST   <34 U/L 11    ALT   <42 U/L 5    GLOBULIN   2.3 - 4.2 g/dL 7.4QVZ    EGFR   >56 LO/VFI/4.33I9 70    Comment: Estimated glomerular filtration rate calculated using the CKD-EPI refit equation.   ANION GAP   7 - 17 mmol/L 11    Resulting Agency [facility]     Specimen Collected: 03/02/22 12:31       IMAGING:  01/2022 MRI Thoracic Spine Impression: Limited study as only sagittal  STIR imaging could be obtained. Post kyphoplasty changes at T10 and T11 with old compression fractures  and bony retropulsion at T11 causing mild spinal canal stenosis    01/2022 MRI Lumbar Spine Impression: post kyphplasty changes at L1 and L3 as well as L4 with old compression injuries. Mass at the posterior column of L4 measuring 2.0x2.5cm demonstrates separation of the spinous processes at L4 on the recent CT scan raising the possibility for a lesion although postoperative changes may be present. Contrast enhanced evaluation of the lumbar spine with sedation is recommended.     Assessment:   82 y.o. 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 Will continue to benefit from palliative care for symptom management in collaboration with trusted providers, longitudinal GOC and advanced care planning.     PLAN:  CLL: In relapse since 2022, not showing symptoms of disease progression with blood test surveillance q55mos with hem/onc Dr. Laveda Norman.   Prostate CA: Current XRT treatment 17 of 28 and then re-evaluate if more treatment indicated. Followed by Dr. Ezzie Dural [facility]. Continues on Lupron injections with Dr. Laveda Norman and Casodex 50mg  daily.   Pain: Continue Oxycodone ER 10mg  q12h and Oxycodone 10mg  q6h PRN for breakthrough, Gabapentin 100mg  BID and Tylenol 1000mg  BID PRN. New Rx sent in for Oxycodone ER sent to [facility] day supply [facility]. Prolia q76months for bone building with Dr. Laveda Norman. Follow up apt with Dr. Bartholomew Boards to discuss any alternative treatment options.   Functional Decline: Pending PT referral.  Edema: Pending PT referral. Compression stockings and elevation. High protein diet.   Weight Loss: 10% weight loss over last year. Continue to monitor appetite and intake.   Adjustment Depression: Recommend start of Duloxetine 30mg  QD with low threshold to increase to 60mg  after 1 week and will send note and follow up call to discuss recommendation with PCP.   Anxiety: Continue with Valium 2mg  QD PRN prior to procedures of lying flat (ie radiation and back imaging). New Rx sent in Valium to [facility] x [facility] day supply [facility].     THE PALLIATIVE CARE TEAM WILL CONTINUE TO VISIT TO PROVIDE SUPPORT FOR PATIENT AND FAMILY. FOR IMMEDIATE NEEDS, PT WILL CONTACT Willeen Cass, DO OR 911 AS PALLIATIVE CARE DOES NOT PROVIDE EMERGENCY CARE.    Next follow up apt 2/2 at 12:30P.      Time In: 2:00P   Time Out: 3:30P   Total time of  visit: 90 minutes, with greater than 50% of the time spent on counseling and coordination of patient care.    Medication Refill    Medications refilled per patient request for Oxycontin and Valium.     I am prescribing this medication for the management of symptoms related to intractable back pain and severe anxiety laying flat prior to procedures for XRT, back imaging     Activities of Daily Living: This medication allows for symptom relief, allowing the patient to continue performing their activities of daily living without impairment.    Adverse events: The patient is not suffering from any adverse events.    Aberrant drug-taking behavior: The patient is not displaying any aberrant drug-taking behavior.      Reviewed instructions for proper use and safe storage of medications.     [employer] reviewed.    Patient advised the NO  REFILLS after hours, on Holidays or on weekends. NO EARLY REFILLS including for lost, destroyed or missing opiates or other controlled medications.    Narcan prescription and instructions regarding proper use in an emergency provided/previously provided to patient.

## 2022-03-07 ENCOUNTER — Encounter

## 2022-03-07 ENCOUNTER — Encounter: Admit: 2022-03-07 | Discharge: 2022-03-07 | Payer: MEDICARE

## 2022-03-07 MED ORDER — morphine CR (MS Contin) 15 mg 12 hr tablet
15 | ORAL_TABLET | Freq: Two times a day (BID) | ORAL | 0 refills | Status: DC
Start: 2022-03-07 — End: 2022-03-24

## 2022-03-07 NOTE — Progress Notes (Signed)
TC from patient Insurance will no longer cover Oxycodone ER (Oxycontin) 10mg  q12h so I sent in alternative long acting Rx Morphine Sulfate ER (MS Contin) 15mg  q12h to [facility] [address] [place].

## 2022-03-07 NOTE — Home Health (Signed)
Pt was seen for initial OT evaluation. Pt is an A&Ox6 pleasant M who was admitted to [facility] from 02/24/22-02/26/22 d/t back pain after kyphoplasty. Discharge dx include spinal stenosis, back pain, compression fx of T11 s/p T10-T11 kyphoplasty with recommendation of f/u neurosurgery in 2 weeks, prostate cancer-receiving radiation.  CT A&P showed moderate fecal material throughout colon compatible with given hx of constipation. No evidence of a mechanical bowel obstruction. Mesenteric adenopathy. A few retroperitoneal lymph nodes notably on the left. No other acute abnormalities.   Thoracic lumbar spine MRI- post kyphoplasty changes at T10 and T11 with old compression fx and bony retropulsion at T11. UA (-), CXR (-), flu and covid (-), Pt is receiving palliative care     PMH: ascending aorta dilatation, tortuous aorta, impaired LV relaxation, PVCs, SVT, NSVT, near-syncope, pulmonary HTN, asthma, former smoker GERD, CLL. 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 Pt received ed on medical suppliers if he changes his mind. transfers- toilet, chair, shower stall MI. ed and training on spinal precautions and body mechanics including no bending/lifting/twisting with good verbalization/demonstration of understanding    Pt and his wife declined further OT as he felt that he didnt need it although this OT encouraged 1-2 more sessions for UB therex and increasing strength/activity tolerance for self-care tasks.

## 2022-03-07 NOTE — Home Health (Signed)
Pt is an 18 yr pleasantman who was admitted to [facility] from 02/24/22-02/26/22 d/t back pain after kyphoplasty. Discharge dx include spinal stenosis, back pain, compression fx of T11 s/p T10-T11 kyphoplasty with recommendation of f/u neurosurgery in 2 weeks, prostate cancer-receiving radiation.  CT A&P showed moderate fecal material throughout colon compatible with given hx of constipation. No evidence of a mechanical bowel obstruction. Mesenteric adenopathy. A few retroperitoneal lymph nodes notably on the left. No other acute abnormalities.     Thoracic lumbar spine MRI- post kyphoplasty changes at T10 and T11 with old compression fx and bony retropulsion at T11. UA (-), CXR (-), flu and covid (-), Pt is receiving palliative care   PMH: ascending aorta dilatation, tortuous aorta, impaired LV relaxation, PVCs, SVT, NSVT, near-syncope, pulmonary HTN, asthma, former smoker GERD, CLL. 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   Pt is indep amb in home with str cane in his home and indep on stairs.  Pt is indep following hep that he had been instructed by PT in previous PT sessions.  Pt feels his pain is being managed by his medicaiton .  Pt  agress that no further PT services are required at this time.  Pt will follow hep.  Pt is following precautions.  Pt will inc his amb as able

## 2022-03-08 ENCOUNTER — Encounter: Admit: 2022-03-08 | Discharge: 2022-03-08 | Payer: MEDICARE

## 2022-03-08 NOTE — Home Health (Signed)
SN visit for assessment and education. Patient a&ox4. Vitals WNL. Patient denies pain at time of visit. Patient continues to take oxycodone and morphine with good effect. Educated patient on medications inlcuding dose, schedule, and side effects. Educated patient on use of narcan for opioid overdose. Patient denies chest pain, no edema noted. Lungs clear. Patient reports good appetite, regular Bms. Patient ambulates slow and steady with cane, denies recent falls. Educated patient on fall risk safety and when to call VNA/911. Patient stated understanding of teaching. Plan to continue weekly visits for skilled assessment.

## 2022-03-11 ENCOUNTER — Encounter

## 2022-03-11 MED ORDER — DULoxetine (Cymbalta) 30 mg DR capsule
30 | ORAL_CAPSULE | Freq: Every day | ORAL | 1 refills | Status: DC
Start: 2022-03-11 — End: 2022-05-05

## 2022-03-11 NOTE — Telephone Encounter (Signed)
TC with patient to check on response to MS Contin 15mg  BID which is going well with no adverse effects. He is inquiring about recommendation for anti-depressant and  we discussed trial of start of Duloxetine 30mg  daily and patient is open to to start. I will call PCP office and see if they would like to start patient on medication or I can call it in. Patient also open to talk therapy if anti-depressant not helpful. PT and OT have d/c'd with plans to rest and heal back. Receiving back brace next week. No follow up with neurosurgeon at this time. He received good news today that he will be done XRT after 26th session.

## 2022-03-11 NOTE — Progress Notes (Signed)
Sent in Rx for Duloxetine DR 30mg  daily #30 Refill 1 to [facility] [place]. LM for PCP office regarding recommendation.

## 2022-03-15 ENCOUNTER — Encounter: Admit: 2022-03-15 | Discharge: 2022-03-15 | Payer: MEDICARE

## 2022-03-15 NOTE — Home Health (Signed)
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  Has 5 treatments left.  Pt has treatments in [place] and is very pleased with his care.  Pt has chronic back pain and takes narcotics as prescribed with good relief.  Pt reports poor appetite.  Teaching provided re protein requirements and good food sources.  Pt is well groomed and appears comfortable at present.  BM regualar and using laxatives as instructed due to narcotics.  skin is warm dry and intact.  Revisit next week.  Instructed to call with any concerns.

## 2022-03-17 NOTE — Unmapped (Signed)
I certify that this patient is confined to home and needs intermittent skilled nursing care, physical therapy and/or speech therapy or continues to need occupational therapy. The patient is under my care, and I have authorized the services on this plan of care and will periodically review the plan.

## 2022-03-18 ENCOUNTER — Encounter

## 2022-03-24 ENCOUNTER — Encounter: Admit: 2022-03-24 | Discharge: 2022-03-24 | Payer: MEDICARE

## 2022-03-24 MED ORDER — morphine CR (MS Contin) 15 mg 12 hr tablet
15 | ORAL_TABLET | Freq: Two times a day (BID) | ORAL | 0 refills | Status: DC
Start: 2022-03-24 — End: 2022-04-21

## 2022-03-24 NOTE — Home Health (Signed)
SN revisit for skilled assessment  Pt has completed all his radiation treatments and is feeling relieved.  Pt states his appetitie is not improved.  Teaching provided re small frequent meals with protein components.  Wife manages medications and assists with care as needed.  Pt has a supportive family.  Plan to revisit next week.  Instructed to call with any concerns.

## 2022-03-24 NOTE — Progress Notes (Signed)
[facility]  every 12 hours #60. Current Rx to run out on 03/27/22.

## 2022-03-25 ENCOUNTER — Encounter

## 2022-03-30 NOTE — Telephone Encounter (Signed)
Called [phone] and LVM to confirm appt for Friday at 1230pm

## 2022-03-31 ENCOUNTER — Encounter: Admit: 2022-03-31 | Discharge: 2022-03-31 | Payer: MEDICARE

## 2022-03-31 NOTE — Telephone Encounter (Signed)
Rush Landmark called me back from [phone] and confirmed appt for Friday at 1230

## 2022-03-31 NOTE — Home Health (Signed)
SN revisit for skilled assessment.  Pt has completed radiation treatments and is feeling better.  Pt appetite slightly improved.  Pt looks brighter and states his antidepressant is really helping him to feel better.  He states he engages more with people and has stopping feeling tearful.  Pain under good control.  Pt reports medication compliance.  Palliative consult next day.  Teaching provided re Palliative care.  Revisit in one week.

## 2022-04-01 ENCOUNTER — Ambulatory Visit: Admit: 2022-04-01 | Attending: Adult Health

## 2022-04-01 ENCOUNTER — Encounter

## 2022-04-01 DIAGNOSIS — C61 Malignant neoplasm of prostate: Secondary | ICD-10-CM

## 2022-04-01 MED ORDER — gabapentin (Neurontin) 100 mg capsule
100 | ORAL_CAPSULE | Freq: Two times a day (BID) | ORAL | 2 refills | Status: DC
Start: 2022-04-01 — End: 2022-06-03

## 2022-04-01 NOTE — Progress Notes (Signed)
[facility]  [place] [address]  Dept: [phone]  Dept Fax: [phone]     Chief Complaint/resaon for visit: Exploration of Treatment Options, CLL, Prostate CA, Pain, Functional Decline, Edema, Weight loss, Adjustment Depression, Anxiety, Dyspnea with exertion     Place of service: Home, spouse present     Type of visit (new/established): Established     HPI: This is 82 y.o. 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 Ricka Burdock with LBP improvement but with persistent right thigh pain. Then with increasing difficulty lying flat for XRT for prostate CA and increasing pain, found more compression and underwent T10-T11 kyphoplasty/biopsy with Dr Sena Hitch on 12/20. 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 Then returned to [facility] 2 days later on Christmas Day with severe debilitating thoracolumbar pain below area of T10/T11 kyphoplasty. He was talking Tylenol and Oxycodone without relief, as well as, constipated with pain and bleeding with defecation. Ultimately, found no longer to be a surgical candidate again, given pain regime of Oxycontin 10mg  BID with Oxycodone 10mg  q6h for breakthrough, and Gabapentin 100mg  BID.  Palliative referral received for ongoing symptom management and GOC. Today, patient being seen for follow up palliative care visit.     At time of this visit, patient responding well with current regime for pain management with MS Contin 15mg  BID with Oxycodone 10mg  q6h PRN and Gabapentin 100mg  BID. Over  the last month, requiring Oxycodone for breakthrough x6 and has not required in 2.5 weeks. He needs new Rx for Gabapentin. Today, rating pain level at 2/10 achy pain across lower thoracic and lumbar spine without radiculopathy or radiation. He continues to use cane for long distance and is maintaining HEP. He has received back brace from [employer] which requires tweaking and will be Financial controller with corrections. Patient considering getting a second opinion on his back and new imaging with [facility]. Patient also responding well to Duloxetine 30mg  daily with improved mood and decreased tearfulness noting he found relief within weeks. Also reports improved appetite and intake. He is no longer requiring Valium 2mg  PRN as he was using to lay flat for Prostate CA treatments.     Patient has completed RT treatment for Prostate CA tolerating well with recommendation to follow up in three months. He continues to see Dr. Danelle Berry for Lupron injections, Prolia infusions and surveillance of CLL. Patient with continued fatigue and SOB with exertion after 100 feet and LE edema concerned about anemia with HGB of 8.8 in January labs wondering if labs can be done at home with VNA prior to Dr. Danelle Berry appointment in April.     Allergies   Allergen Reactions   . Penicillins Rash     Other reaction(s): Not available         Current Outpatient Medications   Medication Sig Dispense Refill   . acetaminophen (Tylenol) 500 mg tablet Take by mouth every 6 (six) hours if needed. 2 tabs BID PRN      . alfuzosin (Uroxatral) 10 mg 24 hr tablet Take 10 mg by  mouth at noon and 10 mg in the evening. Indications: enlarged prostate with urination problem     . ascorbic acid (Vitamin C) 500 mg tablet Take 500 mg by mouth once daily. Indications: inadequate vitamin C     . bicalutamide (Casodex) 50 mg chemo tablet Take 50 mg by mouth once daily. Indications: advanced form of prostate cancer     . calcium carbonate 600 mg calcium (1,500 mg) tablet  Take 1,500 mg by mouth once daily. Indications: osteoporosis, a condition of weak bones     . cholecalciferol (D3-5) 5,000 Units tablet Take 5,000 Units by mouth once daily. Indications: prevention of vitamin D deficiency     . diazePAM (Valium) 2 mg tablet Take 1 tablet (2 mg) by mouth if needed each day (prior to radiation procedure). 30 tablet 0   . DILTIAZEM HCL ORAL Take 120 mg by mouth 1 (one) time each day. Indications: .     . docusate sodium (Colace) 100 mg tablet Take 100 mg by mouth if needed in the morning and at bedtime for constipation. Indications: constipation     . DULoxetine (Cymbalta) 30 mg DR capsule Take 1 capsule (30 mg) by mouth once daily. Do not crush or chew. 30 capsule 1   . gabapentin (Neurontin) 100 mg capsule Take 100 mg by mouth twice daily. Indications: neuropathic pain     . morphine CR (MS Contin) 15 mg 12 hr tablet Take 1 tablet (15 mg) by mouth every 12 (twelve) hours. Do not crush, chew, or split. 60 tablet 0   . multivitamin tablet Take 1 tablet by mouth once daily. Indications: treatment to prevent vitamin deficiency     . naloxone (Narcan) 4 mg/0.1 mL nasal spray Administer 1 spray (4 mg) into affected nostril(s) if needed for opioid reversal. May repeat every 2-3 minutes if needed, alternating nostrils, until medical assistance becomes available. 1 each 0   . omeprazole (PriLOSEC) 20 mg DR capsule Take 20 mg by mouth before breakfast. Do not crush or chew.     . oxyCODONE (Roxicodone) 10 mg immediate release tablet Take 10 mg by mouth every 6 (six) hours if needed for pain score 4-6. Indications: pain     . polyethylene glycol (Glycolax) 17 gram packet Take 17 g by mouth twice daily.     . sennosides (senna) 8.6 mg tablet Take 1 tablet by mouth once daily.       No current facility-administered medications for this visit.        ROS:  CONSTITUTION: no fever/chills, no fatigue, +daytime drowsiness-one nap per day, improved appetite currently 2-3 meals/day, portion sizes 50%  less compared to 6 months ago, + weight loss190lbs a year ago and now 170lbs   EYES: no changes in vision   ENT: no dysphagia, no odynophagia  CV: No CP, no palpitations, no dizziness, no lightheadedness, + LE edema   RESP: no cough, no sputum, reports no SOB at rest or with communication, no orthopnea, +moderate exertion of 100-200 feet  GI: +reflux managed with Prilosec, no n/v, no constipation, no diarrhea, last BM today, no abdominal pain, no bowel incontinence  GU: no urinary incontinence, no dysuria, + urinary frequency at day and night q2h, +urinary retention and BPH   NEURO: no H/A's, no neuropathy, no asymmetrical weakness, + LE weakness from decreased mobility   MUSCULOSKELETAL: + joint stiffness and pain in back (see HPI)  PSYCH/COGNITION: reports improved overwhelming depression and anxiety with current regime and back pain better  managed, + sleep disturbance with urination frequency, no STM loss, no confusion   MOBILITY/Functional Status/ADLs:transfers and ambulates on own, using cane with transfers, stairs and long distances when feeling unsteady   DIET: Boost with protein 2x/day, high protein diet    SKIN: no rash, no breakdown     Elder Abuse Screen: Negative     PH-Q2:   Not performed today     Advance Care Planning   HCP [facility] primary, [employer] alternate; filled out MOLST at recent hospital stays to reflect DNR/DNI wishes, left copy to be filled out a future visit as we did not get to it today     Care team:  Kennyth Lose, DO PCP   Dr. Valetta Mole, Oncologist, managing Prolia q69mos, Lupron injections, CLL (blood tests), anemia next apt in 3 months   Dr. Ricka Burdock, [facility], [address], [place]   Dr. Mitzie Na, Radiation Oncologist, [facility]   Dr. Sena Hitch, Neurosurgeon, [facility], 12/27, awaiting new apt   Dr. Annett Gula, Cardiologist   Dr. Lenna Sciara, Urologist       PE:  BP 122/62 (BP Location: Left arm, Patient Position: Sitting)   Pulse 73   Resp 18   Wt  77.1 kg   SpO2 98%    CONSTITUTIONAL: sitting upright in chair in living room, pleasant and engaged   HEENT: sclera anicteric, conjunctiva non-injected, no oral lesions  CV: RRR, no JVD, +R>L +1-2 bilateral LE edema  PULM: CTA, no accessory muscle use   GI: +BS's, soft, NT/ND, no organomegaly   NEURO: A&Ox3, CN II-XII grossly intact, grip strength intact, UE strength 5/5, LE strength 5/5, speech fluent and clear    MUSCULOSKELETAL: tender to touch over lower thoracic/upper lumbar spine  PSYCH: mood/affect appropriate; judgement and insight intact; memory and recall of recent medical events intact   SKIN: no rash, no breakdown   PPS: 50%     LABS:    Contains abnormal dataCBC and differential  Order: [id number]  Component   Ref Range & Units 2 d ago   WBC   4.00 - 10.00 K/uL 5.04    RBC   4.50 - 6.40 M/uL 2.86Low    HGB   13.5 - 18.0 g/dL 8.8Low    HCT   40.0 - 54.0 % 26.5Low    PLT   150 - 450 K/uL 174    MCV   80.0 - 100.0 fL 92.7    MCH   27.0 - 32.0 pg 30.8    MCHC   32.0 - 36.0 g/dL 33.2    RDW   11.5 - 14.5 % 14.1    MPV   8.4 - 12.0 fl 9.2    NRBC   0 /100 WBCs 0.00    ABSOLUTE NRBC   0 K/uL 0.00    DIFF METHOD  Auto    NEUTS   48.0 - 76.0 % 52.1    LYMPHS   18.0 - 41.0 % 38.9    MONOS   4.0 - 11.0 % 6.0    EOS   0.0 - 5.0 % 2.0    BASOS   0.0 - 1.5 % 0.4    % IMMATURE GRANS   0.0 - 1.0 % 0.6    ABSOLUTE NEUTS   1.92 - 7.60 K/uL 2.63    ABSOLUTE LYMPHS   0.72 - 4.10 K/uL 1.96    ABSOLUTE MONOS   0.16 - 1.10 K/uL 0.30    ABSOLUTE EOS  0.00 - 0.50 K/uL 0.10    ABSOLUTE BASOS   0.00 - 0.15 K/uL 0.02    ABS IMMATURE GRANS   0.00 - 0.10 K/uL 0.03    Resulting Agency [facility]     Specimen Collected: 03/02/22 12:31    Performed by: Roslynn Amble [facility] Last Resulted: 03/02/22 12:35   Received From: [facility]  Result Received: 03/02/22 13:42     Comprehensive metabolic panel  Order: [id number]  Component   Ref Range & Units 2 d ago   SODIUM   136 - 145 mmol/L  135Low    POTASSIUM   3.4 - 5.1 mmol/L 4.0    CHLORIDE   98 - 107 mmol/L 99    CO2   22 - 31 mmol/L 25    BUN   6 - 23 mg/dL 17    CREATININE   0.50 - 1.20 mg/dL 1.07    GLUCOSE   70 - 100 mg/dL 104High    ALBUMIN   3.5 - 5.2 g/dL 4.4    TOTAL PROTEIN   6.4 - 8.3 g/dL 6.1Low    CALCIUM   8.8 - 10.7 mg/dL 9.5    ALKALINE PHOSPHATASE   40 - 129 U/L 96    TOTAL BILIRUBIN   0.2 - 1.2 mg/dL 0.5    AST   <41 U/L 11    ALT   <42 U/L 5    GLOBULIN   2.3 - 4.2 g/dL 1.7Low    EGFR   >59 mL/min/1.58m2 70    Comment: Estimated glomerular filtration rate calculated using the CKD-EPI refit equation.   ANION GAP   7 - 17 mmol/L 11    Resulting Agency [facility]       03/02/2021       IMAGING:  01/2022 MRI Thoracic Spine Impression: Limited study as only sagittal  STIR imaging could be obtained. Post kyphoplasty changes at T10 and T11 with old compression fractures and bony retropulsion at T11 causing mild spinal canal stenosis    01/2022 MRI Lumbar Spine Impression: post kyphplasty changes at L1 and L3 as well as L4 with old compression injuries. Mass at the posterior column of L4 measuring 2.0x2.5cm demonstrates separation of the spinous processes at L4 on the recent CT scan raising the possibility for a lesion although postoperative changes may be present. Contrast enhanced evaluation of the lumbar spine with sedation is recommended.     Assessment:   82 y.o. 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 Will continue to benefit from palliative care for symptom management in collaboration with trusted providers, longitudinal [place] and advanced care planning.      PLAN:  CLL: In relapse since  2022, not showing symptoms of disease progression with blood test surveillance q106mos with hem/onc Dr. Danelle Berry, next apt in April.    Prostate CA: Completed XRT treatment in recent weeks.  Continues on Lupron injections with Dr. Danelle Berry and Casodex 50mg  daily. Followed by Dr. Milas Gain [facility] with follow up in 3 months.   Pain: Continue MS Contin 15mg  q12h and Oxycodone 10mg  q6h PRN for breakthrough, Gabapentin 100mg  BID and Tylenol 1000mg  BID PRN. New Rx sent in [facility]. Prolia q25months for bone building with Dr. Danelle Berry. Follow up apt with Dr. Sena Hitch to discuss any alternative treatment options or second opinion at [facility].   Functional Decline: Continues with HEP and walking around home to maintain strength. PT not indicated at this time.   Edema: Recommend compression stockings and elevation. High protein diet.   Weight Loss: 10% weight loss over last year. Continue to monitor appetite and intake.   Adjustment Depression: Continue with Duloxetine 30mg  QD.   Anxiety: Continue with Valium 2mg  QD PRN for severe anxiety or acute severe muscle spasms.  Dyspnea on exertion and LE edema: Consider CBC and BMP completed with VNA in home to monitor anemia and electrolyte status prior to Dr. Danelle Berry apt.     THE PALLIATIVE CARE TEAM WILL CONTINUE TO VISIT TO PROVIDE SUPPORT FOR PATIENT AND FAMILY. FOR IMMEDIATE NEEDS, PT WILL CONTACT Kennyth Lose, DO, OR 911 AS PALLIATIVE CARE DOES NOT PROVIDE EMERGENCY CARE.    Next follow up visit recommended in 4 weeks.      Time In: 12:20P   Time Out: 1:05P   Total time of visit: 45 minutes, with greater than 50% of the time spent on counseling and coordination of patient care.

## 2022-04-06 ENCOUNTER — Encounter

## 2022-04-07 ENCOUNTER — Encounter: Admit: 2022-04-07 | Discharge: 2022-04-07 | Payer: MEDICARE

## 2022-04-07 NOTE — Home Health (Signed)
SN revisit for skilled assessment   Pt was seen by Palliative care last week and states he may need future lab work drawn for CBC.  SN contacted [place] NP and was told it was being discussed with MD.  Pt is looking brighter and is in good spirits.  Appetite is slowly improving.  BM regular.  Pt reports medication compliance.  Pt was started on new medication Gemtesa 75mg  to help decrease urinary frequency.  Pt has follow up appt with orthotics to adjust his back brace.  Revisit next week.

## 2022-04-08 ENCOUNTER — Encounter

## 2022-04-12 NOTE — Telephone Encounter (Signed)
Called Rush Landmark [phone] and confirmed the follow up for 03/07 at 230

## 2022-04-14 ENCOUNTER — Encounter: Admit: 2022-04-14 | Discharge: 2022-04-14 | Payer: MEDICARE

## 2022-04-14 NOTE — Home Health (Signed)
MEDICAL NECESSITY: s/p kyphoplasty    FOCUS OF SKILLED TREATMENT THIS VISIT: Skilled assessment, wound assessment, CVP, GI/GU assessment, pain management, medication affect/education/compliance, disease process teaching    PATIENT PROGRESS TOWARDS GOALS: Pt AXOX3, VSS, LSC. Pt reprots 8/10 pain when urinating, pt has call out to uroloigst, pt had started [place] a week ago. Reviewed and educated pt about meds and when to call MD. Pt is also followed by palliative. Pt reports eating well. No changes in other meds.    SKILLED TREATMENT TO BE PROVIDED NEXT VISIT: Skilled assessment, wound assessment, CVP, GI/GU assessment, pain management, medication affect/education/compliance, disease process teaching    PATIENT RISK IF GOALS NOT MET: Risk of rehospitalization

## 2022-04-15 ENCOUNTER — Encounter

## 2022-04-15 NOTE — Telephone Encounter (Signed)
TC received for Matthew Werner at Dr. Danelle Berry office they will fax lab orders to [phone] on patient for CBC and CMP for increasing dyspnea with exertion, edema and last HGB at 8, can be done with VNA. Will update VNA team.

## 2022-04-21 ENCOUNTER — Encounter: Admit: 2022-04-21 | Discharge: 2022-04-21 | Payer: MEDICARE

## 2022-04-21 MED ORDER — morphine CR (MS Contin) 15 mg 12 hr tablet
15 | ORAL_TABLET | Freq: Two times a day (BID) | ORAL | 0 refills | Status: DC
Start: 2022-04-21 — End: 2022-05-20

## 2022-04-21 NOTE — Home Health (Signed)
SN revisit for skilled assessment.  Pt reports he is still having a poor appetite.  Teaching provided  re small frequent meals and adequate hydration.  Venipuncture performed for CBC and CMP.  labs delivered to [facility].  Pt tolerated well.  Pt has all medications in the home and reports compliance.  skin is warm dry and intact.  Plan to revisit next week.  Instructed on s/sx to report to SN/MD and to call with any concerns.

## 2022-04-21 NOTE — Telephone Encounter (Signed)
New monthly Rx sent in for patient for Morphine CR (MS Contin) 15mg  BID [facility]     Medication Refill    Medications refilled per patient request for Morphine CR (MS Contin) 15mg  BID    I am prescribing this medication for the management of symptoms related to intractable back pain     Activities of Daily Living: This medication allows for symptom relief, allowing the patient to continue performing their activities of daily living without impairment.    Adverse events: The patient is not suffering from any adverse events.    Aberrant drug-taking behavior: The patient is not displaying any aberrant drug-taking behavior.      Reviewed instructions for proper use and safe storage of medications.     [employer] reviewed.    Patient advised the NO REFILLS after hours, on Holidays or on weekends. NO EARLY REFILLS including for lost, destroyed or missing opiates or other controlled medications.      Narcan prescription and instructions regarding proper use in an emergency provided/previously provided to patient.

## 2022-04-22 ENCOUNTER — Encounter

## 2022-04-28 ENCOUNTER — Encounter: Admit: 2022-04-28 | Discharge: 2022-04-28 | Payer: MEDICARE

## 2022-04-28 NOTE — Home Health (Signed)
SN agency discharge as pt centered goals schieved.  Pt is feeling better and starting to walk outside on level surfaces.  Pt reports confusion from text message from PCP stating they would not provide care for him until he is discharged from Palliative care.  SN  called paaliative team to reach out to PCP for clarification of POC.  Pt expresses concern over not having a PCP.  He states he may seek to find a new provider.  Pt agrees with agency discharge.  Teaching provided re palliative care , s/sx to report to MD, nutrition and hydration, medication management and home safety.  Pt plans to follow up with his Urologist in near future.

## 2022-04-29 ENCOUNTER — Encounter

## 2022-05-04 NOTE — Telephone Encounter (Signed)
Levan did call me back and ok for Matthew Werner to see him at 1130am

## 2022-05-04 NOTE — Telephone Encounter (Signed)
Ova [facility] [phone] to confirm appt for tomorrow and due to changes in the NP schedule moves appt up to earlier for 1130am   Waiting on confirmation from him about the time change.

## 2022-05-05 ENCOUNTER — Encounter: Admit: 2022-05-05 | Attending: Adult Health

## 2022-05-05 DIAGNOSIS — M898X9 Other specified disorders of bone, unspecified site: Secondary | ICD-10-CM

## 2022-05-05 MED ORDER — DULoxetine (Cymbalta) 30 mg DR capsule
30 | ORAL_CAPSULE | Freq: Every day | ORAL | 1 refills | Status: DC
Start: 2022-05-05 — End: 2022-07-27

## 2022-05-05 NOTE — Unmapped (Signed)
Palliative Care  [address]  [place], [place] [address]  Dept: [phone]     Chief Complaint: Exploration of Treatment Options, CLL, Prostate CA,Pain, Functional Decline,Edema, DOE,Weight loss,AdjustmentDepression, Anxiety    Reason for visit: Palliative     Place of service: Home, spouse present     Type of visit (new/established): Established        PIR:JJOACZY E Namon Villarin  82 y.o. 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 Ricka Burdock with LBP improvement but with persistent right thigh pain. Then with increasing difficulty lying flat for XRT for prostate CA and increasing pain, found more compression and underwent T10-T11 kyphoplasty/biopsy withDr Ty on 12/20. 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 Then returned to [facility] 2 days later on Christmas Day with severe debilitating thoracolumbar pain below area of T10/T11 kyphoplasty. He was talking Tylenol and Oxycodone without relief, as well as, constipated with pain and bleeding with defecation. Ultimately, found no longer to be a surgical candidate again, given pain regime of Oxycontin 10mg  BID with Oxycodone 10mg  q6h for breakthrough, and Gabapentin 100mg  BID.  Palliative referral received for ongoing symptom management and GOC. Today, patient being seen for follow up palliative care visit. .     At time of this visit, patient had CBC and CMP recently drawn given increasing SOB with exertion and LE edema. CBC showed improved anemia from previous labs in December 2023 and stable CMP with slightly worsened Cr at  1.3 up from 1.07. Patient with increased functional urgency incontinence discussed with Urologist Dr. Gwenyth Allegra without recent infection thought to be potential XRT effect. Gemtasa 150mg  daily has been added unknown effect as just started 2 days ago. Patient reports improved functional status able to get around home and a couple outside walks with severe pain levels treated. During visit, reports 0/10 pain at rest. Reports moderate pain levels exacerbated 4-5/10 after standing or walking too long or certain sleep positions. Denies radiculopathy and when pain is felt described as soreness/achiness in lumbar region. He continues with pain regime of MS Contin 15mg  q12 hours and has not required Oxycodone for breakthrough pain. Patient still considering second opinion with [facility] for neurosurgery for his back and also inquiring about outpatient PT at [facility] referral for deconditioning. Reports improved mood and inquiring about tapering off Cymbalta.     Allergies   Allergen Reactions   . Penicillins Rash     Other reaction(s): Not available        Current Outpatient Medications   Medication Sig Dispense Refill   . acetaminophen (Tylenol) 500 mg tablet Take by mouth every 6 (six) hours if needed. 2 tabs BID PRN      . alfuzosin (Uroxatral) 10 mg 24 hr tablet Take 10 mg by mouth at noon and 10 mg in the evening. Indications: enlarged prostate with urination problem     . ascorbic acid (Vitamin C) 500 mg tablet Take 500 mg by mouth once daily. Indications: inadequate vitamin C     . bicalutamide (Casodex) 50 mg chemo tablet Take 50 mg by mouth once daily. Indications: advanced form of prostate cancer     . calcium carbonate 600 mg calcium (1,500 mg) tablet  Take 1,500 mg by mouth once daily. Indications: osteoporosis, a condition of weak bones     . cholecalciferol (D3-5) 5,000 Units tablet Take 5,000 Units by mouth once daily. Indications: prevention of vitamin D deficiency     . diazePAM (Valium) 2 mg tablet Take 1 tablet (2  mg) by mouth if needed each day (prior to radiation procedure). 30 tablet 0   . DILTIAZEM HCL ORAL Take 120 mg by mouth 1 (one) time each day. Indications: .     . docusate sodium (Colace) 100 mg tablet Take 100 mg by mouth if needed in the morning and at bedtime for constipation. Indications: constipation     . DULoxetine (Cymbalta) 30 mg DR capsule Take 1 capsule (30 mg) by mouth once daily. Do not crush or chew. 30 capsule 1   . gabapentin (Neurontin) 100 mg capsule Take 1 capsule (100 mg) by mouth twice daily. 60 capsule 2   . morphine CR (MS Contin) 15 mg 12 hr tablet Take 1 tablet (15 mg) by mouth every 12 (twelve) hours. Do not crush, chew, or split. 60 tablet 0   . multivitamin tablet Take 1 tablet by mouth once daily. Indications: treatment to prevent vitamin deficiency     . naloxone (Narcan) 4 mg/0.1 mL nasal spray Administer 1 spray (4 mg) into affected nostril(s) if needed for opioid reversal. May repeat every 2-3 minutes if needed, alternating nostrils, until medical assistance becomes available. 1 each 0   . omeprazole (PriLOSEC) 20 mg DR capsule Take 20 mg by mouth before breakfast. Do not crush or chew.     . oxyCODONE (Roxicodone) 10 mg immediate release tablet Take 10 mg by mouth every 6 (six) hours if needed for pain score 4-6. Indications: pain     . polyethylene glycol (Glycolax) 17 gram packet Take 17 g by mouth twice daily.     . sennosides (senna) 8.6 mg tablet Take 1 tablet by mouth once daily.     Marland Kitchen vibegron (Gemtesa) 75 mg tablet tablet Take 150 mg by mouth once daily.       No current facility-administered medications for this visit.       Review of Systems   CONSTITUTION: no fever/chills, no fatigue,+daytime drowsiness-onenapper day, improved appetite currently2-3 meals/day, portion sizes 50% less compared to6 months ago,+weight loss190lbs a year ago and today at 169lbs  EYES: no changes in vision  ENT: no dysphagia, no odynophagia  CV: No CP, no palpitations, no dizziness, no  lightheadedness, + LEedemaimproved with compression stockings, elevation and overnight, worsens as day goes on with dependent edema  RESP: no cough, no sputum, reports no SOB at rest or with communication, no orthopnea, +moderate exertion of 100-200 feet  GI: +refluxmanaged with Prilosec, no n/v, no constipation, no diarrhea, last BMtoday, no abdominal pain,no bowel incontinence  GU: +urge and functional urinaryincontinence (see HPI), no dysuria,+urinary frequency at day and night q2h, +urinary retention and BPH   NEURO:no H/A's, no neuropathy, no asymmetrical weakness, +LE weakness from decreased mobility  MUSCULOSKELETAL: +joint stiffness andpain in back(see HPI)  PSYCH/COGNITION: reports improved overwhelmingdepression andanxietywith current regime and back pain better managed,+sleep disturbance with urination frequency, no STM loss, no confusion   MOBILITY/Functional Status/ADLs:transfers and ambulates on own, using cane with transfers, stairs and long distanceswhen feeling unsteady  DIET: Boostwith protein2x/day, high protein diet  SKIN: no rash, no breakdown    Elder Abuse Screen: Negative   PH-Q2:   Not performed today  Advance Care Planning   HCP Wray Kearns primary, Monica Sahellene alternate;filled out MOLST at recent hospital stays to reflect DNR/DNI wishes, copy in the home     Care team:  Kennyth Lose, DO PCP   Dr. Valetta Mole, Oncologist, managing Prolia q79mos, Luproninjections, CLL (blood tests),anemianext aptin3 months   Dr. Ricka Burdock, [facility], [address] [place]   Dr. Mitzie Na, Radiation Oncologist, [facility]   Dr. Sena Hitch, Neurosurgeon, [facility]  Dr. Annett Gula, Cardiologist  Dr. Lenna Sciara, Urologist      PE:  BP 122/64 (BP Location: Right arm, Patient Position: Sitting)   Pulse 63   Resp 18   Wt 77.1 kg   SpO2 98%    CONSTITUTIONAL:sitting upright in chair in living room, pleasant and engaged  HEENT: sclera anicteric,  conjunctiva non-injected, no oral lesions  CV: RRR, no JVD, +R>L +1-2 bilateral LEedema  PULM: CTA, no accessory muscle use   GI:+BS's, soft, NT/ND, no organomegaly  NEURO: A&Ox3,CN II-XII grossly intact, grip strength intact, UE strength 5/5, LE strength 5/5, speech fluent and clear  MUSCULOSKELETAL: tender to touch over lower thoracic/upper lumbar spine  PSYCH: mood/affect appropriate; judgement and insight intact; memory and recall of recent medical events intact  SKIN: no rash, no breakdown  PPS: 50%    LABS:  04/21/2022 NA 134, K+ 4.4, CR  1.3, eGFR 56.3, TP 6.3, Albumin 4.6, AST 14, ALT 8, WBC 5.0, RBC 3.31, HGB 10.5, HCT 31.3, PLT 157      IMAGING:  01/2022 MRI Thoracic Spine Impression: Limited study as only sagittal STIR imaging could be obtained. Post kyphoplasty changes at T10 and T11 with old compression fractures and bony retropulsion at T11 causing mild spinal canal stenosis    01/2022 MRI Lumbar Spine Impression: post kyphplasty changes at L1 and L3 as well as L4 with old compression injuries. Mass at the posterior column of L4 measuring 2.0x2.5cm demonstrates separation of the spinous processes at L4 on the recent CT scan raising the possibility for a lesion although postoperative changes may be present. Contrast enhanced evaluation of the lumbar spine with sedation is recommended.     Assessment:   82 y.o. male with intractable back pain due to recurrent compression fractures s/p multiple kyphoplasty's and laminectomy (no longer a surgical candidate) due to probable cancer treatment bone loss in setting ofCLL (firstdiagnosed 2010 completedchemotherapy,inremission,until2022) andprostate CA (dx'dSummer 2023, completedXRTx26 days, on Casodex 50mg  daily). Will continue to benefit from palliative care for symptom management in collaboration with trusted providers, longitudinal [place] and advanced care planning.    THE PALLIATIVE CARE TEAM WILL CONTINUE TO VISIT TO PROVIDE SUPPORT  FOR PATIENT AND FAMILY. FOR IMMEDIATE NEEDS, PT WILL CONTACT [facility], OR 911 AS PALLIATIVE CARE DOES NOT PROVIDE EMERGENCY CARE.    PLAN:  CLL:In relapse since 2022, not showing symptoms of disease progression with blood test surveillance q78mos with hem/onc Dr. Danelle Berry, next apt in April 2024.   Prostate HA:[id number] XRT treatment in 02/2022.  Continues on Lupron injections with Dr. Danelle Berry and Casodex 50mg  daily.Followed by Dr. Milas Gain [facility] with follow up in 3 months.   Pain:Recommend taper decrease of Gabapentin to 100mg  QHS without radiculopathy pain and discuss d/c at next visit. Continue MS Contin 15mg  q12h and Oxycodone 10mg  q6h PRN for breakthrough and Tylenol 1000mg  BID PRN. Prolia q53months for bone building with Dr. Danelle Berry. Follow up apt with Dr. Sena Hitch to discuss any alternative treatment options or second opinion at [facility]. Recommend repeat spine  imaging in June/July 2024.   Functional Decline:Recommend outpatient PT referral with [facility] for deconditioning and I will call PCP to discuss referral. Continues with HEP and walking around home to maintain strength.   Edema/Dyspnea with exertion: Recommend follow up appointment with cardiology. Continue with compression stockings and elevation. High protein diet.  Weight Loss:10% weight loss over last year. Continue to monitor appetite and intake. Weight stable since last visit.   AdjustmentDepression:Recommend taper and d/c off Duloxetine 30mg  QD and monitor response at next visit.   Anxiety:Continue with Valium 2mg  QD PRN for severe anxiety or acute severe muscle spasms.    Next palliative visit 4/4 at 11:30a.     Total time of this visit encounter: 60 minutes, with the time spent on pre-visit chart review, face to face time with the patient, discussion and counseling with the patient and family, review of the case with the care team, and documentation in the medical record .

## 2022-05-06 NOTE — Telephone Encounter (Signed)
Called Rondy and changed appt for 04/05 at 1130am instead of 04/04.

## 2022-05-20 MED ORDER — morphine CR (MS Contin) 15 mg 12 hr tablet
15 | ORAL_TABLET | Freq: Two times a day (BID) | ORAL | 0 refills | Status: DC
Start: 2022-05-20 — End: 2022-06-22

## 2022-05-20 NOTE — Progress Notes (Signed)
New monthly Rx sent in for patient for Morphine CR (MS Contin) 15mg  BID #60 to [facility].     Medication Refill    Medications refilled per patient request for Morphine CR (MS Contin) 15mg  BID    I am prescribing this medication for the management of symptoms related to intractable back pain     Activities of Daily Living: This medication allows for symptom relief, allowing the patient to continue performing their activities of daily living without impairment.    Adverse events: The patient is not suffering from any adverse events.    Aberrant drug-taking behavior: The patient is not displaying any aberrant drug-taking behavior.      Reviewed instructions for proper use and safe storage of medications.     MassPat reviewed.    Patient advised the NO REFILLS after hours, on Holidays or on weekends. NO EARLY REFILLS including for lost, destroyed or missing opiates or other controlled medications.      Narcan prescription and instructions regarding proper use in an emergency provided/previously provided to patient.

## 2022-05-26 LAB — CMP (EXT)
ALT/SGPT (EXT): 6 U/L (ref <42)
AST/SGOT (EXT): 13 U/L (ref <41)
Albumin (EXT): 5 g/dL (ref 3.5–5.2)
Alkaline Phosphatase (EXT): 72 U/L (ref 40–129)
Anion Gap (EXT): 10 mmol/L (ref 7–17)
BUN (EXT): 20 mg/dL (ref 6–23)
Bilirubin, Total (EXT): 0.9 mg/dL (ref 0.2–1.2)
CO2 (EXT): 23 mmol/L (ref 22–31)
CalciumCalcium (EXT): 9.7 mg/dL (ref 8.8–10.7)
Chloride (EXT): 105 mmol/L (ref 98–107)
Creatinine (EXT): 1.03 mg/dL (ref 0.50–1.20)
GFR Estimated (Calc) (EXT): 73 mL/min/{1.73_m2} (ref >59)
Globulin (EXT): 1.6 g/dL — ABNORMAL LOW (ref 2.3–4.2)
Glucose (EXT): 96 mg/dL (ref 70–100)
Potassium (EXT): 4.6 mmol/L (ref 3.4–5.1)
Protein (EXT): 6.6 g/dL (ref 6.4–8.3)
Sodium (EXT): 138 mmol/L (ref 136–145)

## 2022-05-27 NOTE — Telephone Encounter (Signed)
TC from patient and daughter Matthew Werner, patient with diarrhea x1 week not responding to Imodium and went to oncology yesterday and received IV fluids for hydration and overnight with 4 episodes of diarrhea and persists today. They inquired about palliative capability to do IV fluids and stool sample in the home and responded palliative care does not have this capability. They were on their way to urgent care to try get stool sample completed to r/o infectious process. Also recommended start of Lomotil as add on to Imodium as well as bulk forming laxative (ie Metamucil). Also they are in touch with patients GI specialist but they are not in until Monday and booking out but will call back on Monday.     Patient with increased back pain with acute diarrhea. We discussed re-establishing referral with orthopedic or neurosurgeon Dr. Bartholomew Boards to do repeat imaging on lumbar spine and assess treatment options.     I am scheduled to see patient for follow up on 4/5.

## 2022-06-01 NOTE — Telephone Encounter (Signed)
CalledBill [phone] confirmed appt for Friday at 1130am

## 2022-06-03 ENCOUNTER — Encounter: Admit: 2022-06-03 | Payer: MEDICARE | Attending: Adult Health

## 2022-06-03 DIAGNOSIS — M898X9 Other specified disorders of bone, unspecified site: Secondary | ICD-10-CM

## 2022-06-03 NOTE — Unmapped (Signed)
Palliative Care  [address]  [place]  [place], [place] [address]  Dept: [phone]     Chief Complaint: Exploration of Treatment Options,CLL, Prostate CA,Pain, Functional Decline, Weight loss,AdjustmentDepression, Anxiety    Reason for visit: Symptom Management, GOC    Place of service: Home, spouse Darel Hong     Type of visit (new/established): Established      WGN:FAOZHYQ E Kamal Jurgens  82 y.o. 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 Robert Bellow with LBP improvement but with persistent right thigh pain. Then with increasing difficulty lying flat for XRT for prostate CA and increasing pain, found more compression and underwent T10-T11 kyphoplasty/biopsy withDr Ty on 12/20. 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 Then returned to [facility] 2 days later on Christmas Day with severe debilitating thoracolumbar pain below area of T10/T11 kyphoplasty. He was talking Tylenol and Oxycodone without relief, as well as, constipated with pain and bleeding with defecation. Ultimately, found no longer to be a surgical candidate again, given pain regime of Oxycontin 10mg  BID with Oxycodone 10mg  q6h for breakthrough,andGabapentin 100mg  BID. Palliative referral received for ongoing symptom managementand GOC. Today, patient being seen for follow up palliative care visit.    At time of this visit, patient with recent bout of diarrhea x1 week with symptoms resolving in recent days with treatment of Imodium and Lomotil. Reports last BM 2 days ago with plans to restart Senna 2 tabs daily. Also,  noticed return of GERD in this time and has started back on Omeprazole 20mg  daily. With acute illness, patient with increasing back pain in recent weeks with breakthrough requirement of Oxycodone x2. He continues on MS Contin 15mg  BID. Today at visit, back pain is 0/10 at rest and can become moderate with movement described as achy, can be sharp localized to lumbar with no c/o's of radiation. At last visit, we decreased Gabapentin dosing to 100mg  QHS and recently it needed to be d/c'd altogether for concerns with drug interaction and patient feels it had no made difference so will continue to be d/c'd. Also at visit, coming off Cymbalta was discussed and taper started but patient and wife felt symptoms started to return and kept on it. He is sleeping better at night, no overnight complaints of pain and urinary frequency has improved with Gemtesa 150mg  QHS.     We discussed returning to PT after being deconditioned with back pain over recent months and returning to orthopedic or neurosurgeon for repeat back imaging. At this time, patient no longer has a PCP as PCP did not want to continue with patient with palliative care involved. Patient has decided to pursue new PCP and hoping I can update orthopedic/neurosurgeon to see if they are open to seeing patient again and discussing PT referral and need for repeat back imaging.     Patient with recent apt with hem/onc Dr. Laveda Norman receiving his q 3 month Lupron injection with good response and did end up having IVF at the time due to his extensive diarrhea at the time.     Allergies   Allergen Reactions   . Penicillins Rash     Other reaction(s): Not available        Current Outpatient  Medications   Medication Sig Dispense Refill   . acetaminophen (Tylenol) 500 mg tablet Take by mouth every 6 (six) hours if needed. 2 tabs BID PRN      . alfuzosin (Uroxatral) 10 mg 24 hr tablet Take 10 mg by mouth at noon and 10 mg in the evening. Indications: enlarged prostate with  urination problem     . ascorbic acid (Vitamin C) 500 mg tablet Take 500 mg by mouth once daily. Indications: inadequate vitamin C     . bicalutamide (Casodex) 50 mg chemo tablet Take 50 mg by mouth once daily. Indications: advanced form of prostate cancer     . calcium carbonate 600 mg calcium (1,500 mg) tablet Take 1,500 mg by mouth once daily. Indications: osteoporosis, a condition of weak bones     . cholecalciferol (D3-5) 5,000 Units tablet Take 5,000 Units by mouth once daily. Indications: prevention of vitamin D deficiency     . diazePAM (Valium) 2 mg tablet Take 1 tablet (2 mg) by mouth if needed each day (prior to radiation procedure). 30 tablet 0   . DILTIAZEM HCL ORAL Take 120 mg by mouth 1 (one) time each day. Indications: .     . docusate sodium (Colace) 100 mg tablet Take 100 mg by mouth if needed in the morning and at bedtime for constipation. Indications: constipation     . DULoxetine (Cymbalta) 30 mg DR capsule Take 1 capsule (30 mg) by mouth once daily. Do not crush or chew. 30 capsule 1   . morphine CR (MS Contin) 15 mg 12 hr tablet Take 1 tablet (15 mg) by mouth every 12 (twelve) hours. Do not crush, chew, or split. 60 tablet 0   . multivitamin tablet Take 1 tablet by mouth once daily. Indications: treatment to prevent vitamin deficiency     . naloxone (Narcan) 4 mg/0.1 mL nasal spray Administer 1 spray (4 mg) into affected nostril(s) if needed for opioid reversal. May repeat every 2-3 minutes if needed, alternating nostrils, until medical assistance becomes available. 1 each 0   . omeprazole (PriLOSEC) 20 mg DR capsule Take 20 mg by mouth before breakfast. Do not crush or chew.     . oxyCODONE (Roxicodone) 10 mg immediate release tablet Take 10 mg by mouth every 6 (six) hours if needed for pain score 4-6. Indications: pain     . polyethylene glycol (Glycolax) 17 gram packet Take 17 g by mouth twice daily.     . sennosides (senna) 8.6 mg tablet Take 1 tablet by mouth once daily.     Marland Kitchen vibegron  (Gemtesa) 75 mg tablet tablet Take 150 mg by mouth once daily.       No current facility-administered medications for this visit.       Review of Systems   CONSTITUTION: no fever/chills, no fatigue,+daytime drowsiness-onenapper day,improvedappetite currently2-3 meals/day, portion sizes 50% less compared to6 months ago,+weight loss190lbs a year ago, last visit at 169lbs and today at 160lbs  EYES: no changes in vision  ENT: no dysphagia, no odynophagia  CV: No CP, no palpitations, no dizziness, no lightheadedness, no edema improved after bouts of diarrhea  RESP: no cough, no sputum, reports no SOB at rest or with communication, no orthopnea, +dyspnea on moderate exertion of 100-200 feet  GI: +refluxmanaged with Prilosec, no n/v, no constipation, no diarrhea, last BMtoday, no abdominal pain,no bowel incontinence  GU: no urinaryincontinence, no dysuria,+urinary frequency, burning and urge improved with Gemtesa   NEURO:no H/A's, no neuropathy, no asymmetrical  weakness, +LE weakness from decreased mobility  MUSCULOSKELETAL: +joint stiffness andpain in back(see HPI)  PSYCH/COGNITION: reports manageddepression andanxietywith current regime,nosleep disturbance, no STM loss, no confusion   MOBILITY/Functional Status/ADLs:transfers and ambulates on own, using cane with transfers, stairs and long distanceswhen feeling unsteady  DIET: Boostwith protein2x/day, high protein diet  SKIN: no rash, no breakdown      Elder Abuse Screen: Negative     PH-Q2:   Not performed today       Advance Care Planning    HCP Laruth Bouchard primary, Monica Sahellene alternate;filled out MOLST at recent hospital stays to reflect DNR/DNI wishes, copy in the home     Care team:  Willeen Cass, DO PCP - no longer PCP, looking for new one  Dr. Cliffton Asters, Oncologist, managing Prolia q105mos, Luproninjections, CLL (blood tests),anemianext aptApril 2024  Dr. Robert Bellow, [facility], [address], [place]   Dr.  Sheliah Plane, Radiation Oncologist, [facility]   Dr. Bartholomew Boards, Neurosurgeon, [facility]  Dr. Alyssa Grove, Cardiologist  Dr. Gregor Hams, Urologist      PE:  BP 112/60 (BP Location: Right arm, Patient Position: Sitting)   Pulse 65   Resp 18   Wt 72.6 kg   SpO2 96%    CONSTITUTIONAL:sitting upright in chair in living room,NAD, pleasant andengaged  HEENT: sclera anicteric, conjunctiva non-injected, no oral lesions  CV: RRR, no JVD, noedema  PULM: CTA, no accessory muscle use   GI:+BS's, soft, NT/ND, no organomegaly  NEURO: A&Ox4,CN II-XII grossly intact, grip strength intact, UE strength 5/5, LE strength 5/5, speech fluent and clear  MUSCULOSKELETAL: tender to touch over lower thoracic/upper lumbar spine  PSYCH: mood/affect appropriate; judgement and insight intact; memory and recall of recent medical events intact  SKIN: no rash, no breakdown  PPS: 50%    LABS:  WBC   4.00 - 10.00 K/uL 8.56    RBC   4.50 - 6.40 M/uL 3.96Low    HGB   13.5 - 18.0 g/dL 96.0AVW    HCT   09.8 - 54.0 % 36.4Low    PLT   150 - 450 K/uL 152    MCV   80.0 - 100.0 fL 91.9    MCH   27.0 - 32.0 pg 31.3    MCHC   32.0 - 36.0 g/dL 11.9    RDW   14.7 - 82.9 % 12.0    MPV   8.4 - 12.0 fl 9.2    NRBC   0 /100 WBCs 0.00    ABSOLUTE NRBC   0 K/uL 0.00    DIFF METHOD  Auto    NEUTS   48.0 - 76.0 % 40.1Low    LYMPHS   18.0 - 41.0 % 51.8High    MONOS   4.0 - 11.0 % 5.8    EOS   0.0 - 5.0 % 1.2    BASOS   0.0 - 1.5 % 0.2    % IMMATURE GRANS   0.0 - 1.0 % 0.9    ABSOLUTE NEUTS   1.92 - 7.60 K/uL 3.43    ABSOLUTE LYMPHS   0.72 - 4.10 K/uL 4.43High    ABSOLUTE MONOS   0.16 - 1.10 K/uL 0.50    ABSOLUTE EOS   0.00 - 0.50 K/uL 0.10    ABSOLUTE BASOS   0.00 - 0.15 K/uL 0.02    ABS IMMATURE GRANS   0.00 - 0.10 K/uL 0.08    Resulting Agency [facility]     Specimen Collected:  05/26/22 10:28     SODIUM   136 - 145 mmol/L 138    POTASSIUM   3.4 - 5.1 mmol/L 4.6    CHLORIDE   98 - 107 mmol/L 105    CO2   22 - 31  mmol/L 23    BUN   6 - 23 mg/dL 20    CREATININE   1.61 - 1.20 mg/dL 0.96    GLUCOSE   70 - 100 mg/dL 96    ALBUMIN   3.5 - 5.2 g/dL 5.0    TOTAL PROTEIN   6.4 - 8.3 g/dL 6.6    CALCIUM   8.8 - 10.7 mg/dL 9.7    ALKALINE PHOSPHATASE   40 - 129 U/L 72    TOTAL BILIRUBIN   0.2 - 1.2 mg/dL 0.9    AST   <04 U/L 13    ALT   <42 U/L 6    GLOBULIN   2.3 - 4.2 g/dL 5.4UJW    EGFR   >11 BJ/YNW/2.95A2 73    Comment: Estimated glomerular filtration rate calculated using the CKD-EPI refit equation.   ANION GAP   7 - 17 mmol/L 10    Resulting Agency [facility]     Specimen Collected: 05/26/22 10:28       IMAGING:  01/2022 MRI Thoracic Spine Impression: Limited study as only sagittal STIR imaging could be obtained. Post kyphoplasty changes at T10 and T11 with old compression fractures and bony retropulsion at T11 causing mild spinal canal stenosis    01/2022 MRI Lumbar Spine Impression: post kyphplasty changes at L1 and L3 as well as L4 with old compression injuries. Mass at the posterior column of L4 measuring 2.0x2.5cm demonstrates separation of the spinous processes at L4 on the recent CT scan raising the possibility for a lesion although postoperative changes may be present. Contrast enhanced evaluation of the lumbar spine with sedation is recommended.     Assessment:   82 y.o. male with intractable back pain due to recurrent compression fractures s/p multiple kyphoplasty's and laminectomy (no longer a surgical candidate) due to probable cancer treatment bone loss in setting ofCLL (firstdiagnosed 2010 completedchemotherapy,inremission,until2022) andprostate CA (dx'dSummer 2023, completedXRTx26days, on Casodex 50mg  daily). Will continue to benefit from palliative care for symptom management and longitudinal GOC with providers    THE PALLIATIVE CARE TEAM WILL CONTINUE TO VISIT TO PROVIDE SUPPORT FOR PATIENT AND FAMILY. FOR IMMEDIATE NEEDS, PT WILL CONTACT [facility], OR 911 AS PALLIATIVE CARE DOES  NOT PROVIDE EMERGENCY CARE.    PLAN:  CLL:In relapse since 2022, not showing symptoms of disease progression with blood test surveillance q51mos with hem/onc Dr. Laveda Norman, next apt in April 2024.   Prostate [id number] treatment in 02/2022. Continues on Lupron injections with Dr. Laveda Norman and Casodex 50mg  daily.Followed by Dr. Ezzie Dural [facility].   Pain: ContinueMS Contin 15mg q12h and Oxycodone 10mg  q6h PRN for breakthrough and Tylenol 1000mg  BID PRN. Prolia q55months for bone building with Dr. Laveda Norman. I will reach out to Dr. Bartholomew Boards and Dr. Alphonzo Dublin office to discuss need for repeat imaging in coming months.   Functional Decline:Recommend PT referral for deconditioning will discuss with Dr. Bartholomew Boards and Dr. Alphonzo Dublin office if they are willing to see patient and put in new PT referral for outpatient PT at [facility]. Continues with HEP and walking around home to maintain strength.   Weight Loss:10% weight loss over last year. Continue to monitor appetite and intake.   AdjustmentDepression with chronic pain:Continue Duloxetine 30mg   QD.   Anxiety:Continue with Valium 2mg  QD PRNfor severe anxiety or acute severe muscle spasms associated with intractable back pain.     Next palliative visit scheduled for 5/9 at 1p.     Total time of this visit encounter: 60 minutes, with the time spent on pre-visit chart review, face to face time with the patient, discussion and counseling with the patient and family, review of the case with the care team, and documentation in the medical record .

## 2022-06-22 MED ORDER — morphine CR (MS Contin) 15 mg 12 hr tablet
15 | ORAL_TABLET | Freq: Two times a day (BID) | ORAL | 0 refills | Status: DC
Start: 2022-06-22 — End: 2022-07-26

## 2022-06-22 NOTE — Progress Notes (Signed)
Medication Refill    Medications refilled per patient request for Morphine CR 15mg  BID.     I am prescribing this medication for the management of symptoms related to severe lumbar back pain.     Activities of Daily Living: This medication allows for symptom relief, allowing the patient to continue performing their activities of daily living without impairment.    Adverse events: The patient is not suffering from any adverse events.    Aberrant drug-taking behavior: The patient is not displaying any aberrant drug-taking behavior.      Reviewed instructions for proper use and safe storage of medications.     MassPat reviewed.    Patient advised the NO REFILLS after hours, on Holidays or on weekends. NO EARLY REFILLS including for lost, destroyed or missing opiates or other controlled medications.      Narcan prescription and instructions regarding proper use in an emergency provided/previously provided to patient.

## 2022-07-01 NOTE — Progress Notes (Signed)
Is the patient appropriate for Home Care? {YES,NO:32825}    Homebound Status: ***  Covid status: ***  Insurance carrier: Payor: MEDICARE / Plan: MEDICARE PART A AND B / Product Type: Medicare /     Date of potential discharge: ***    Is there a next day need? {TMCAH NEXT DAY NEED:32532}    Does the patient have a PCP? {TMCA[facility] Liaison PCP:32531}    Is the patient a [facility] patient?{TMCAH Patient?:32536}    Does patient use Home O2{TMCAH Home O2:32533}    Have your cordinated with the IP care team on the patients IV? {YES,NO:32825}  Have your coordinated with the IP care team on the patients Drains? {YES,NO:32825}  Have your coordinated with the IP care team on the patients Wounds? {YES,NO:32825}

## 2022-07-05 NOTE — Telephone Encounter (Signed)
CalledBill [phone] and confirmed appt for 05/09 at 1pm

## 2022-07-07 ENCOUNTER — Encounter: Admit: 2022-07-07 | Payer: MEDICARE | Attending: Adult Health

## 2022-07-07 DIAGNOSIS — M898X9 Other specified disorders of bone, unspecified site: Secondary | ICD-10-CM

## 2022-07-07 NOTE — Unmapped (Signed)
Palliative Care  [address]  [place]  [place], [place] [address]  Dept: [phone]     Chief Complaint: Exploration of Treatment Options,CLL, Prostate CA,Pain, Functional Decline, Weight loss,AdjustmentDepression, Anxiety    Reason for visit: Symptom Management, GOC     Place of service: Home, spouse Darel Hong    Type of visit (new/established): Established       WUX:LKGMWNU E Waymon Derenne  82 y.o. 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 Robert Bellow with LBP improvement but with persistent right thigh pain. Then with increasing difficulty lying flat for XRT for prostate CA and increasing pain, found more compression and underwent T10-T11 kyphoplasty/biopsy withDr Ty on 12/20. 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 Then returned to [facility] 2 days later on Christmas Day with severe debilitating thoracolumbar pain below area of T10/T11 found no longer to be surgical candidate with recommendation to follow with palliative care for pain management while pursuing CA treatment. Today, patient being seen for follow up palliative care visit and symptom management for pain.     At time of this visit, patient re-establishing care with orthopedic at [facility] Dr. Lindaann Pascal with records received after undergoinf repeat lumbar MRI in 05/2022 with no new compression fractures found but with "severe neuroforaminal narrowing at L5-S1 bilaterally." This week he received 4 cortisone shots to lumbar and thoracic spine with good effect. He will be starting PT with [facility] on 5/22  and has follow up apt with Dr. Lindaann Pascal in 4 weeks. Patient also with new PCP office Marcelline Mates, NP of [facility].     Denies pain at rest during visit and reports worst pain gets is 4/10  with prolonged standing and movement described as achy, localized to lumbar spine with no c/o's of radiation. He continues on Morphine CR 15mg  q12h and has not required Oxycodone for breakthrough pain. We have started to discuss taper of long acting opioid with start of PT and stabilization in coming months.     Patient also seen recently by radiation oncology with no new concerns and next oncology apt in June where he receives Lupron and Prolia injection. He is also anticipating colonoscopy and endoscopy this month with GI for recent bouts of colitis and severe GERD and concerns with food taking a while to digest.     Allergies   Allergen Reactions   . Penicillins Rash     Other reaction(s): Not available        Current Outpatient Medications   Medication Sig Dispense Refill   . acetaminophen (Tylenol) 500 mg tablet Take by mouth every 6 (six) hours if needed. 2 tabs BID PRN      . alfuzosin (Uroxatral) 10 mg 24 hr tablet Take 10 mg by mouth at noon and 10 mg in the evening. Indications: enlarged prostate with urination problem     . ascorbic acid (Vitamin C) 500 mg tablet Take 500 mg by mouth once daily. Indications: inadequate vitamin C     . aspirin 81 mg EC tablet Take 81 mg by mouth once daily.     . bicalutamide (Casodex) 50 mg chemo tablet Take 50 mg by  mouth once daily. Indications: advanced form of prostate cancer     . calcium carbonate 600 mg calcium (1,500 mg) tablet Take 1,500 mg by mouth once daily. Indications: osteoporosis, a condition of weak bones     . cholecalciferol (D3-5) 5,000 Units tablet Take 5,000 Units by mouth once daily. Indications: prevention of vitamin D deficiency     . diazePAM (Valium) 2 mg tablet Take 1 tablet (2 mg) by mouth if needed each day (prior to radiation  procedure). 30 tablet 0   . DILTIAZEM HCL ORAL Take 120 mg by mouth 1 (one) time each day. Indications: .     . docusate sodium (Colace) 100 mg tablet Take 100 mg by mouth if needed in the morning and at bedtime for constipation. Indications: constipation     . DULoxetine (Cymbalta) 30 mg DR capsule Take 1 capsule (30 mg) by mouth once daily. Do not crush or chew. 30 capsule 1   . morphine CR (MS Contin) 15 mg 12 hr tablet Take 1 tablet (15 mg) by mouth every 12 (twelve) hours. Do not crush, chew, or split. 60 tablet 0   . multivitamin tablet Take 1 tablet by mouth once daily. Indications: treatment to prevent vitamin deficiency     . naloxone (Narcan) 4 mg/0.1 mL nasal spray Administer 1 spray (4 mg) into affected nostril(s) if needed for opioid reversal. May repeat every 2-3 minutes if needed, alternating nostrils, until medical assistance becomes available. 1 each 0   . omeprazole (PriLOSEC) 20 mg DR capsule Take 20 mg by mouth before breakfast. Do not crush or chew.     . oxyCODONE (Roxicodone) 10 mg immediate release tablet Take 10 mg by mouth every 6 (six) hours if needed for pain score 4-6. Indications: pain     . polyethylene glycol (Glycolax) 17 gram packet Take 17 g by mouth twice daily.     . sennosides (senna) 8.6 mg tablet Take 1 tablet by mouth once daily.     Marland Kitchen vibegron (Gemtesa) 75 mg tablet tablet Take 150 mg by mouth once daily.       No current facility-administered medications for this visit.       Review of Systems   CONSTITUTION: no fever/chills, no fatigue,+daytime drowsiness-onenapper day,improvedappetite currently2-3 meals/day, portion sizes 50% less compared to6 months ago,+weight loss190lbs a year ago, last visit at 160lbs and today at 165lbs with some weight gain with improved appetite  EYES: no changes in vision  ENT: no dysphagia, no odynophagia  CV: No CP, no palpitations, no dizziness, no lightheadedness, +LE edema not improved with compression socks   RESP: no cough, no  sputum, reports no SOB at rest or with communication, no orthopnea, +dyspnea on moderate exertion of 100-200 feet  GI: +reflux recently worsened, no n/v, no constipation, no diarrhea-softer stools, last BMtoday, no abdominal pain,no bowel incontinence  GU: nourinaryincontinence, no dysuria,+urinary frequency, burning and urge improved and managed with Gemtesa   NEURO:no H/A's, no neuropathy, no asymmetrical weakness, +LE weakness from decreased mobility  MUSCULOSKELETAL: +joint stiffness andpain in back(see HPI)  PSYCH/COGNITION: reports manageddepression andanxietywith current regime,nosleep disturbance, no STM loss, no confusion   MOBILITY/Functional Status/ADLs:transfers and ambulates on own, using cane with transfers, stairs and long distanceswhen feeling unsteady  DIET: Boostwith protein2x/day, high protein diet  SKIN: no rash, no breakdown      Elder Abuse Screen: Negative     PH-Q2:   Not performed today       Advance Care Planning  HCP wife Laruth Bouchard primary, daughter Marella Chimes alternate;MOLST completed to reflect DNR/DNI wishes,copy in the home    Care team:  Marcelline Mates, [facility], [place]    Dr. Cliffton Asters, Oncologist, managing Prolia q74mos, Luproninjections, CLL (blood tests), next apt 6/24  Dr. Lindaann Pascal, [facility], [address], [place], f/u in 4 weeks  Dr. Sheliah Plane, Radiation Oncologist, [facility]   Dr. Bartholomew Boards, Neurosurgeon, [facility]  Dr. Alyssa Grove, Cardiologist  Dr. Gregor Hams, Urologist  Dr. Zenda Alpers, GI     PE:  BP 138/58 (BP Location: Right arm, Patient Position: Sitting)   Pulse 77   Resp 18   Wt 74.8 kg   SpO2 97%    PPS Score: 60%  CONSTITUTIONAL:sitting upright in chair in living room,NAD, pleasant andengaged  HEENT: sclera anicteric, conjunctiva non-injected, no oral lesions  CV: RRR, no JVD, +LE bilateral edema non pitting through shins  PULM: CTA, no accessory muscle use   GI:+BS's, soft,  NT/ND, no organomegaly  NEURO: A&Ox4,CN II-XII grossly intact, grip strength intact, UE strength 5/5, LE strength 5/5, speech fluent and clear  MUSCULOSKELETAL: tender over lower thoracic/upper lumbar spine; no deformities   PSYCH: mood/affect appropriate; judgement and insight intact; memory and recall of recent medical events intact  SKIN: no rash, no breakdown    LABS:  WBC   4.00 - 10.00 K/uL 8.56    RBC   4.50 - 6.40 M/uL 3.96Low    HGB   13.5 - 18.0 g/dL 16.1WRU    HCT   04.5 - 54.0 % 36.4Low    PLT   150 - 450 K/uL 152    MCV   80.0 - 100.0 fL 91.9    MCH   27.0 - 32.0 pg 31.3    MCHC   32.0 - 36.0 g/dL 40.9    RDW   81.1 - 91.4 % 12.0    MPV   8.4 - 12.0 fl 9.2    NRBC   0 /100 WBCs 0.00    ABSOLUTE NRBC   0 K/uL 0.00    DIFF METHOD  Auto    NEUTS   48.0 - 76.0 % 40.1Low    LYMPHS   18.0 - 41.0 % 51.8High    MONOS   4.0 - 11.0 % 5.8    EOS   0.0 - 5.0 % 1.2    BASOS   0.0 - 1.5 % 0.2    % IMMATURE GRANS   0.0 - 1.0 % 0.9    ABSOLUTE NEUTS   1.92 - 7.60 K/uL 3.43    ABSOLUTE LYMPHS   0.72 - 4.10 K/uL 4.43High    ABSOLUTE MONOS   0.16 - 1.10 K/uL 0.50    ABSOLUTE EOS   0.00 - 0.50 K/uL 0.10    ABSOLUTE BASOS   0.00 - 0.15 K/uL 0.02    ABS IMMATURE GRANS   0.00 - 0.10 K/uL 0.08    Resulting Agency [facility]     Specimen Collected: 05/26/22 10:28     SODIUM   136 - 145 mmol/L 138    POTASSIUM   3.4 - 5.1 mmol/L 4.6    CHLORIDE   98 - 107 mmol/L 105    CO2   22 - 31 mmol/L 23    BUN   6 - 23 mg/dL 20    CREATININE   7.82 - 1.20 mg/dL 9.56    GLUCOSE   70 - 100 mg/dL 96  ALBUMIN   3.5 - 5.2 g/dL 5.0    TOTAL PROTEIN   6.4 - 8.3 g/dL 6.6    CALCIUM   8.8 - 10.7 mg/dL 9.7    ALKALINE PHOSPHATASE   40 - 129 U/L 72    TOTAL BILIRUBIN   0.2 - 1.2 mg/dL 0.9    AST   <44 U/L 13    ALT   <42 U/L 6    GLOBULIN   2.3 - 4.2 g/dL 0.3KVQ    EGFR   >25 ZD/GLO/7.56E3 73    Comment: Estimated glomerular filtration rate calculated using the CKD-EPI refit equation.   ANION GAP   7 - 17  mmol/L 10    Resulting Agency [facility]     Specimen Collected: 05/26/22 10:28             IMAGING:  01/2022 MRI Thoracic Spine Impression: Limited study as only sagittal STIR imaging could be obtained. Post kyphoplasty changes at T10 and T11 with old compression fractures and bony retropulsion at T11 causing mild spinal canal stenosis    01/2022 MRI Lumbar Spine Impression: post kyphplasty changes at L1 and L3 as well as L4 with old compression injuries. Mass at the posterior column of L4 measuring 2.0x2.5cm demonstrates separation of the spinous processes at L4 on the recent CT scan raising the possibility for a lesion although postoperative changes may be present. Contrast enhanced evaluation of the lumbar spine with sedation is recommended.     Assessment:   82 y.o. male with severe back pain due to recurrent compression fractures s/p multiple kyphoplasty's and laminectomy (no longer a surgical candidate) due to probable cancer treatment bone loss in setting ofCLL (firstdiagnosed 2010 completedchemotherapy,inremission,until2022) andprostate CA (dx'dSummer 2023, completedXRTx26days, on Casodex 50mg  daily). Will continue to benefit from palliative care for pain symptom management and longitudinal GOC with providers.     THE PALLIATIVE CARE TEAM WILL CONTINUE TO VISIT TO PROVIDE SUPPORT FOR PATIENT AND FAMILY. FOR IMMEDIATE NEEDS, PT WILL CONTACT [facility], OR 911 AS PALLIATIVE CARE DOES NOT PROVIDE EMERGENCY CARE.    PLAN:  CLL:In relapse since 2022, not showing symptoms of disease progression with blood test surveillance q110mos with hem/onc Dr. Laveda Norman, next apt in June 2024.  Prostate PI:[id number] treatment in1/2024. Continues on Lupron injections with Dr. Laveda Norman and Casodex 50mg  daily.Followed by Dr. Ezzie Dural [facility].   Pain:ContinueMS Contin 15mg q12h and Oxycodone 10mg  q6h PRN for breakthrough and Tylenol 1000mg  BID PRN. Prolia q84months for bone building  with Dr. Laveda Norman. Now follows with [facility]opedic [facility] with Dr. Lindaann Pascal for thoracic and lumbar cortisone shots with follow up in 4 weeks.   Functional Decline: PT referral to start at [facility] on 5/22. Continues with HEP and walking around home to maintain strength.   Weight Loss:10% weight loss over last year. Continue to monitor appetite and intake.  AdjustmentDepression with chronic pain:Continue Duloxetine 30mg  QD.   Anxiety:Continue with Valium 2mg  QD PRNfor severe anxiety or acute severe muscle spasms associated with intractable back pain.     Next follow up palliative apt on 6/6 at 11am.     Total time of this visit encounter: 60 minutes, with the time spent on pre-visit chart review, face to face time with the patient, discussion and counseling with the patient and family, review of the case with the care team, and documentation in the medical record.

## 2022-07-08 NOTE — Telephone Encounter (Signed)
Call to pcp, md made aware pt has declined VNA services and will begin outpt phys therapy with [facility] rehab.

## 2022-07-08 NOTE — Telephone Encounter (Signed)
Patient is not homebound and already has an appt with [facility] set up. Requesting to cancel VNA referral.

## 2022-07-08 NOTE — Telephone Encounter (Signed)
Confirmed with pt's wife that pt is sched to begin outpt phys therapy on 5/22 at [place] and that he does not wish to have PT at home. Vn to update md

## 2022-07-26 MED ORDER — morphine CR (MS Contin) 15 mg 12 hr tablet
15 | ORAL_TABLET | Freq: Two times a day (BID) | ORAL | 0 refills | Status: DC
Start: 2022-07-26 — End: 2022-08-23

## 2022-07-26 NOTE — Progress Notes (Signed)
Medication Refill    Medications refilled per patient request for MS Contin 15 mg PO Q 12 hours    I am prescribing this medication for the management of symptoms related to intractable back pain in the setting of metastatic prostate cancer.    Activities of Daily Living: This medication allows for symptom relief, allowing the patient to continue performing their activities of daily living without impairment.    Adverse events: The patient is not suffering from any adverse events.    Aberrant drug-taking behavior: The patient is not displaying any aberrant drug-taking behavior.      Reviewed instructions for proper use and safe storage of medications.     MassPat reviewed.    Patient advised the NO REFILLS after hours, on Holidays or on weekends. NO EARLY REFILLS including for lost, destroyed or missing opiates or other controlled medications.      Narcan prescription and instructions regarding proper use in an emergency previously provided to patient.

## 2022-07-27 MED ORDER — DULoxetine (Cymbalta) 30 mg DR capsule
30 | ORAL_CAPSULE | Freq: Every day | ORAL | 2 refills | Status: DC
Start: 2022-07-27 — End: 2022-10-20

## 2022-07-27 NOTE — Progress Notes (Signed)
New Rx Duloxetine 30mg  DR capsule daily #30 with 2 refills sent into [facility].

## 2022-08-03 NOTE — Telephone Encounter (Signed)
Called [phone] spoke to [place] and confirmed appt for tomorrow at 11am

## 2022-08-04 ENCOUNTER — Encounter: Admit: 2022-08-04 | Attending: Adult Health

## 2022-08-04 DIAGNOSIS — Z7189 Other specified counseling: Secondary | ICD-10-CM

## 2022-08-04 NOTE — Unmapped (Signed)
Palliative Care  [address]  [place]  [place], [place] [address]  Dept: [phone]     Chief Complaint: Exploration of Treatment Options, CLL, Prostate CA, Pain, Functional Decline, Weight loss, Adjustment Depression, Anxiety, Hypotension     Reason for visit: Symptom Management, GOC     Place of service: Home, spouse Darel Hong    Type of visit (new/established): Established       BJY:NWGNFAO E Kjell Kielar  82 y.o. 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 Robert Bellow with LBP improvement but with persistent right thigh pain. Then with increasing difficulty lying flat for XRT for prostate CA and increasing pain, found more compression and underwent T10-T11 kyphoplasty/biopsy with Dr Bartholomew Boards on 12/20. 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 Then returned to [facility] 2 days later on Christmas Day with severe debilitating thoracolumbar pain below area of T10/T11 found no longer to be surgical candidate with recommendation to follow with palliative care for pain management while pursuing CA treatment and surveillance. Today, patient being seen for follow up palliative care visit.     At time of this visit, patient with episodes of diarrhea and GI discomfort in recent months completed endoscopy and colonoscopy with no new concerns. GI and Urology with thoughts diarrhea r/t XRT treatment in January. Patient has started PT at [facility] with good effect on posture, endurance and strength and now wearing back brace more per PT recommendation. He has apt with orthopedic Dr. Lindaann Pascal tomorrow post 4  cortisone shots to back one month ago. We reviewed Lumbar MRI from 05/2022 and inquiring about if patient needs to continue Morphine CR 15mg  q12h without new compression fractures but with MRI showing "severe neuroforaminal narrowing at L5-S1 bilaterally."  Patient to inquire tomorrow. Today, he reports 4/10 achy, continuous pain in mid lower back that improves with sitting and exacerbates with prolonged standing or walking. He is also noting pain at his sides that he feels might be radiating from spine which he will try to correlate with orthopedic tomorrow as well. Gabapentin was d/c'd a couple months ago and wondering if this radiating pain was improved with its use.     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 Dermatologist noting with CLL on board patient at risk for skin CA to be more aggressive and that she was going to send records and update Dr. Laveda Norman.     Patient also reporting weight loss over recent weeks, noting at last visit, he was at 165lbs and today at 150lbs. Notes appetite continues to be good and about the same. He has not been drinking protein supplements. He has a referral with a nutritionist coming up. He is also with improved LE edema with no edema on board with compression stockings in place, wearing them daily.       Allergies   Allergen Reactions    Penicillins Rash     Other reaction(s): Not available         Current Outpatient Medications   Medication Sig Dispense  Refill    acetaminophen (Tylenol) 500 mg tablet Take by mouth every 6 (six) hours if needed. 2 tabs BID PRN       alfuzosin (Uroxatral) 10 mg 24 hr tablet Take 10 mg by mouth at noon and 10 mg in the evening. Indications: enlarged prostate with urination problem      ascorbic acid (Vitamin C) 500 mg tablet Take 500 mg by mouth once daily. Indications: inadequate vitamin C      aspirin 81 mg EC tablet Take 81  mg by mouth once daily.      bicalutamide (Casodex) 50 mg chemo tablet Take 50 mg by mouth once daily. Indications: advanced form of prostate cancer      calcium carbonate 600 mg calcium (1,500 mg) tablet Take 1,500 mg by mouth once daily. Indications: osteoporosis, a condition of weak bones      cholecalciferol (D3-5) 5,000 Units tablet Take 5,000 Units by mouth once daily. Indications: prevention of vitamin D deficiency      diazePAM (Valium) 2 mg tablet Take 1 tablet (2 mg) by mouth if needed each day (prior to radiation procedure). 30 tablet 0    DILTIAZEM HCL ORAL Take 120 mg by mouth 1 (one) time each day. Indications: .      docusate sodium (Colace) 100 mg tablet Take 100 mg by mouth if needed in the morning and at bedtime for constipation. Indications: constipation      DULoxetine (Cymbalta) 30 mg DR capsule Take 1 capsule (30 mg) by mouth once daily. Do not crush or chew. 30 capsule 2    morphine CR (MS Contin) 15 mg 12 hr tablet Take 1 tablet (15 mg) by mouth every 12 (twelve) hours. Do not crush, chew, or split. 60 tablet 0    multivitamin tablet Take 1 tablet by mouth once daily. Indications: treatment to prevent vitamin deficiency      naloxone (Narcan) 4 mg/0.1 mL nasal spray Administer 1 spray (4 mg) into affected nostril(s) if needed for opioid reversal. May repeat every 2-3 minutes if needed, alternating nostrils, until medical assistance becomes available. 1 each 0    omeprazole (PriLOSEC) 20 mg DR capsule Take 20 mg by mouth before breakfast. Do not crush or chew.      oxyCODONE (Roxicodone) 10 mg immediate release tablet Take 10 mg by mouth every 6 (six) hours if needed for pain score 4-6. Indications: pain      polyethylene glycol (Glycolax) 17 gram packet Take 17 g by mouth twice daily.      sennosides (senna) 8.6 mg tablet Take 1 tablet by mouth once daily.      vibegron (Gemtesa) 75 mg tablet tablet Take 150 mg by mouth once daily.       No current facility-administered medications for this  visit.       Review of Systems   CONSTITUTION: no fever/chills, no fatigue, +daytime drowsiness-one nap per day, good appetite currently 2-3 meals/day, portion sizes 50% less compared to 6 months ago, + weight loss 190lbs a year ago, 04/2022 160lbs, 05/2022 165lbs, 150lbs today   EYES: no changes in vision    ENT: no dysphagia, no odynophagia  CV: No CP, no palpitations, no dizziness, no lightheadedness, no LE edema   RESP: no cough, no sputum, reports no SOB at rest or with communication, no orthopnea, +dyspnea on moderate exertion of 100-200 feet  GI: +reflux managed with omeprazole, no n/v, no constipation, no diarrhea, last BM today, no abdominal pain, no bowel  incontinence  GU: no urinary incontinence, no dysuria, + urinary frequency, burning and urge improved and managed with Gemtesa   NEURO:  no H/A's, no neuropathy, no asymmetrical weakness, + LE weakness   MUSCULOSKELETAL: + joint stiffness and pain in back (see HPI)  PSYCH/COGNITION: reports managed depression and anxiety with current regime, no sleep disturbance, no STM loss, no confusion   MOBILITY/Functional Status/ADLs: transfers and ambulates on own, cane for long distances, performs own ADLs  DIET: Boost with protein 2x/day, high protein diet    SKIN: no rash, no breakdown          Elder Abuse Screen: Negative     PH-Q2:   Not performed today       Advance Care Planning   HCP wife Laruth Bouchard primary, daughter Marella Chimes alternate; MOLST completed to reflect DNR/DNI wishes, copy in the home      Care team:  Marcelline Mates, [facility], [place]    Dr. Cliffton Asters, Oncologist, managing Prolia q82mos, Lupron injections, CLL (blood tests), next apt 6/24  Dr. Lindaann Pascal, [facility], [address] [place], apt tomorrow  Dr. Sheliah Plane, Radiation Oncologist, [facility]   Dr. Bartholomew Boards, Neurosurgeon, [facility]  Dr. Alyssa Grove, Cardiologist   Dr. Gregor Hams, Urologist    Dr. Zenda Alpers, GI   Dr. Algis Liming, Pulmonologist      PE:  BP  102/58 (BP Location: Right arm, Patient Position: Sitting)   Pulse 78   Resp 18   Wt 68 kg   SpO2 97%    PPS Score: 60%  CONSTITUTIONAL:  sitting upright in chair in living room, NAD, pleasant and engaged   HEENT: sclera anicteric, conjunctiva non-injected, no oral lesions  CV: RRR, no JVD, no edema with compression stockings in place   PULM: CTA, no accessory muscle use   GI:  +BS's, soft, NT/ND, no organomegaly    NEURO: A&Ox4, CN II-XII grossly intact, grip strength intact, UE strength 5/5, LE strength 5/5, speech fluent and clear    MUSCULOSKELETAL: no tenderness; no deformities   PSYCH: mood/affect appropriate; judgement and insight intact; memory and recall of recent medical events intact    SKIN: no rash, no breakdown         LABS:  WBC   4.00 - 10.00 K/uL 8.56    RBC   4.50 - 6.40 M/uL 3.96 Low     HGB   13.5 - 18.0 g/dL 16.1 Low     HCT   09.6 - 54.0 % 36.4 Low     PLT   150 - 450 K/uL 152    MCV   80.0 - 100.0 fL 91.9    MCH   27.0 - 32.0 pg 31.3    MCHC   32.0 - 36.0 g/dL 04.5    RDW   40.9 - 81.1 % 12.0    MPV   8.4 - 12.0 fl 9.2    NRBC   0 /100 WBCs 0.00    ABSOLUTE NRBC   0 K/uL 0.00    DIFF METHOD  Auto    NEUTS   48.0 - 76.0 % 40.1 Low     LYMPHS   18.0 - 41.0 % 51.8 High     MONOS   4.0 - 11.0 % 5.8    EOS   0.0 - 5.0 % 1.2    BASOS   0.0 - 1.5 % 0.2    % IMMATURE GRANS   0.0 - 1.0 % 0.9  ABSOLUTE NEUTS   1.92 - 7.60 K/uL 3.43    ABSOLUTE LYMPHS   0.72 - 4.10 K/uL 4.43 High     ABSOLUTE MONOS   0.16 - 1.10 K/uL 0.50    ABSOLUTE EOS   0.00 - 0.50 K/uL 0.10    ABSOLUTE BASOS   0.00 - 0.15 K/uL 0.02    ABS IMMATURE GRANS   0.00 - 0.10 K/uL 0.08    Resulting Agency [facility]      Specimen Collected: 05/26/22 10:28      SODIUM   136 - 145 mmol/L 138    POTASSIUM   3.4 - 5.1 mmol/L 4.6    CHLORIDE   98 - 107 mmol/L 105    CO2   22 - 31 mmol/L 23    BUN   6 - 23 mg/dL 20    CREATININE   1.61 - 1.20 mg/dL 0.96    GLUCOSE   70 - 100 mg/dL 96    ALBUMIN   3.5 - 5.2 g/dL 5.0    TOTAL  PROTEIN   6.4 - 8.3 g/dL 6.6    CALCIUM   8.8 - 10.7 mg/dL 9.7    ALKALINE PHOSPHATASE   40 - 129 U/L 72    TOTAL BILIRUBIN   0.2 - 1.2 mg/dL 0.9    AST   <04 U/L 13    ALT   <42 U/L 6    GLOBULIN   2.3 - 4.2 g/dL 1.6 Low     EGFR   >54 mL/min/1.34m2 73    Comment: Estimated glomerular filtration rate calculated using the CKD-EPI refit equation.   ANION GAP   7 - 17 mmol/L 10    Resulting Agency [facility]      Specimen Collected: 05/26/22 10:28          IMAGING:  Lumbar MRI in 05/2022 with no new compression fractures found but with "severe neuroforaminal narrowing at L5-S1 bilaterally."      Assessment:   82 y.o. 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 Will continue to benefit from palliative care for pain symptom management and longitudinal GOC with providers.       THE PALLIATIVE CARE TEAM WILL CONTINUE TO VISIT TO PROVIDE SUPPORT FOR PATIENT AND FAMILY. FOR IMMEDIATE NEEDS, PT WILL CONTACT [facility], OR 911 AS PALLIATIVE CARE DOES NOT PROVIDE EMERGENCY CARE.     PLAN:  CLL: In relapse since 2022, not showing symptoms of disease progression with blood test surveillance q46mos with hem/onc Dr. Laveda Norman, next apt in June 2024.   Prostate CA: Completed XRT treatment in 02/2022. Continues on Lupron injections with Dr. Laveda Norman and Casodex 50mg  daily. Followed by Dr. Ezzie Dural [facility].   Pain:  Continue MS Contin 15mg  q12h and Oxycodone 10mg  q6h PRN for breakthrough and Tylenol 1000mg  BID PRN. Prolia q41months for bone building with Dr. Laveda Norman. [facility]s [facility] with Dr. Lindaann Pascal for thoracic and lumbar cortisone shots with follow up apt 6/7. Post appointment will discuss ongoing pain management regime  correlating with MRI findings in 05/2022 and clinical assessment.   Functional Decline: Continues with PT at [facility] at twice per week incorporating pool work soon.   Weight Loss: 21% weight  loss over last year. Nutritionist referral in place. Encouraged protein supplements BID and small frequent meals.   Adjustment Depression with chronic pain: Continue Duloxetine 30mg  QD.   Anxiety: Continue with Valium 2mg  QD PRN for severe anxiety or acute severe muscle spasms associated with intractable back pain.   Hypotension: 102/58 today and encouraged to move slow with positional changes and push fluids post blood loss from squamous cell CA procedure yesterday. Will be seeing dermatologist today for excessive bleeding.     Next palliative apt recommended in 6-8 weeks.      Total time of this visit encounter: 60 minutes, with the time spent on pre-visit chart review, face to face time with the patient, discussion and counseling with the patient and family, review of the case with the care team, and documentation in the medical record .

## 2022-08-12 NOTE — Telephone Encounter (Signed)
Called [name] Stable [phone] and scheduled a follow up appt to see Matthew Werner for 07/18 1030am

## 2022-08-22 LAB — CMP (EXT)
ALT/SGPT (EXT): 8 U/L (ref <42)
AST/SGOT (EXT): 11 U/L (ref <41)
Albumin (EXT): 4.6 g/dL (ref 3.5–5.2)
Alkaline Phosphatase (EXT): 64 U/L (ref 40–129)
Anion Gap (EXT): 11 mmol/L (ref 7–17)
BUN (EXT): 20 mg/dL (ref 6–23)
Bilirubin, Total (EXT): 0.6 mg/dL (ref 0.2–1.2)
CO2 (EXT): 27 mmol/L (ref 22–31)
CalciumCalcium (EXT): 10.3 mg/dL (ref 8.8–10.7)
Chloride (EXT): 101 mmol/L (ref 98–107)
Creatinine (EXT): 1.06 mg/dL (ref 0.50–1.20)
GFR Estimated (Calc) (EXT): 70 mL/min/{1.73_m2} (ref >59)
Globulin (EXT): 1.5 g/dL — ABNORMAL LOW (ref 2.3–4.2)
Glucose (EXT): 97 mg/dL (ref 70–100)
Potassium (EXT): 4.1 mmol/L (ref 3.4–5.1)
Protein (EXT): 6.1 g/dL — ABNORMAL LOW (ref 6.4–8.3)
Sodium (EXT): 139 mmol/L (ref 136–145)

## 2022-08-23 MED ORDER — morphine CR (MS Contin) 15 mg 12 hr tablet
15 | ORAL_TABLET | Freq: Two times a day (BID) | ORAL | 0 refills | Status: DC
Start: 2022-08-23 — End: 2022-09-17

## 2022-08-23 NOTE — Progress Notes (Signed)
Medication Refill    Medications refilled per patient request for Morphine CR 15mg  BID    I am prescribing this medication for the management of symptoms related to cancer related bone loss with intractable back pain.     Activities of Daily Living: This medication allows for symptom relief, allowing the patient to continue performing their activities of daily living without impairment.    Adverse events: The patient is not suffering from any adverse events.    Aberrant drug-taking behavior: The patient is not displaying any aberrant drug-taking behavior.      Reviewed instructions for proper use and safe storage of medications.     [employer] reviewed.    Patient advised the NO REFILLS after hours, on Holidays or on weekends. NO EARLY REFILLS including for lost, destroyed or missing opiates or other controlled medications.      Narcan prescription and instructions regarding proper use in an emergency provided/previously provided to patient.

## 2022-09-13 NOTE — Telephone Encounter (Addendum)
[name] [phone]       Confirmed appt for 07/18 at 1030am

## 2022-09-15 ENCOUNTER — Encounter: Admit: 2022-09-15 | Payer: MEDICARE | Attending: Adult Health

## 2022-09-15 DIAGNOSIS — Z7189 Other specified counseling: Secondary | ICD-10-CM

## 2022-09-15 NOTE — Unmapped (Signed)
Palliative Care  [address]  [address]  [place], [place] [address]  Dept: [phone]     Chief Complaint: Exploration of Treatment Options, CLL, Prostate CA, Pain, Functional Decline, Weight loss, Adjustment Depression, Anxiety, Pressure Injury stage 1, DOE      Reason for visit: Symptom Management     Place of service: Home, spouse Darel Hong    Type of visit (new/established): Established      HQI:ONGEXBM E Long Brimage  82 y.o. 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 Robert Bellow with LBP improvement but with persistent right thigh pain. Then with increasing difficulty lying flat for XRT for prostate CA and increasing pain, found more compression and underwent T10-T11 kyphoplasty/biopsy with Dr Bartholomew Boards on 12/20. 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 In 01/2022 found with severe debilitating thoracolumbar pain below area of T10/T11 found no longer to be surgical candidate with recommendation to follow with palliative care for pain management while pursuing CA treatment and surveillance. Today, patient being seen for follow up palliative care visit.     At time of this visit, patient continues to see Orthopedic Dr. Lindaann Pascal about once/month with next appointment tomorrow to evaluate recent epidural cortisone shots to thoracic back, patient reporting some improvement for a couple weeks. Today, reporting 4/10 continuous pain to mid thoracic back described as achy and radiating around sides. Patient continues on MS Contin 15mg  BID and has not required Oxycodone for breakthrough, he  takes Tylenol ES 1-2 tabs as needed with good response. He has noticed more pain coming around bilateral sides/rib page but also endorsing more side to side movement in pool PT recently and will mention to PT to see if aggravating. Pain does not get higher than 5. We had discussed potentially introducing gabapentin again for radiating pain but at this time Orthopedic would like to hold off until evaluations on response to steroid shots. Patient continues with PT at 2x/week at [facility], mostly in the pool and thinks will transition soon out of the pool and seems improvement in strength and less unsteadiness on his feet.     He is with recent apts with pulmonology and hem/onc. With increased dyspnea on exertion of 50-100 feet, met with pulmonology and was started on new maintenance inhaler [place] 1 puff BID. He is also scheduled to do PFTs and chest CT scan on 7/30. Recently seeing Dr. Laveda Norman for prolia infusion and lupron injection, discussed lab work showing mild signs in increased relapse of CLL. No further treatment recommended at this time especially while he remains on Lupron injections for next 3 years. Reports recent PSA wnl.     Patient endorses improvement in appetite and intake with weight gain up to 158lbs from 150lbs a couple months ago. He is still with some GERD managed with Omeprazole. He reports some tenderness and redness with no open areas on coccyx where he has had pressure injury stage 2 before putting Aquaphor on daily.     He is due to see PCP in August.        Allergies   Allergen Reactions    Penicillins Rash  Other reaction(s): Not available         Current Outpatient Medications   Medication Sig Dispense Refill    acetaminophen (Tylenol) 500 mg tablet Take by mouth every 6 (six) hours if needed. 2 tabs BID PRN       alfuzosin (Uroxatral) 10 mg 24 hr tablet Take 10 mg by mouth at noon and 10 mg in the evening. Indications: enlarged prostate with urination problem      ascorbic acid (Vitamin  C) 500 mg tablet Take 500 mg by mouth once daily. Indications: inadequate vitamin C      aspirin 81 mg EC tablet Take 81 mg by mouth once daily.      bicalutamide (Casodex) 50 mg chemo tablet Take 50 mg by mouth once daily. Indications: advanced form of prostate cancer      calcium carbonate 600 mg calcium (1,500 mg) tablet Take 1,500 mg by mouth once daily. Indications: osteoporosis, a condition of weak bones      cholecalciferol (D3-5) 5,000 Units tablet Take 5,000 Units by mouth once daily. Indications: prevention of vitamin D deficiency      diazePAM (Valium) 2 mg tablet Take 1 tablet (2 mg) by mouth if needed each day (prior to radiation procedure). 30 tablet 0    DILTIAZEM HCL ORAL Take 120 mg by mouth 1 (one) time each day. Indications: .      docusate sodium (Colace) 100 mg tablet Take 100 mg by mouth if needed in the morning and at bedtime for constipation. Indications: constipation      DULoxetine (Cymbalta) 30 mg DR capsule Take 1 capsule (30 mg) by mouth once daily. Do not crush or chew. 30 capsule 2    morphine CR (MS Contin) 15 mg 12 hr tablet Take 1 tablet (15 mg) by mouth every 12 (twelve) hours. Do not crush, chew, or split. 60 tablet 0    multivitamin tablet Take 1 tablet by mouth once daily. Indications: treatment to prevent vitamin deficiency      naloxone (Narcan) 4 mg/0.1 mL nasal spray Administer 1 spray (4 mg) into affected nostril(s) if needed for opioid reversal. May repeat every 2-3 minutes if needed, alternating nostrils, until medical assistance becomes available. 1 each 0    omeprazole (PriLOSEC) 20 mg DR capsule Take 20 mg by mouth before breakfast. Do not crush or chew.      oxyCODONE (Roxicodone) 10 mg immediate release tablet Take 10 mg by mouth every 6 (six) hours if needed for pain score 4-6. Indications: pain      polyethylene glycol (Glycolax) 17 gram packet Take 17 g by mouth twice daily.      sennosides (senna) 8.6 mg tablet Take 1 tablet by mouth once daily.      vibegron  (Gemtesa) 75 mg tablet tablet Take 150 mg by mouth once daily.      fluticasone propion-salmeteroL (Advair Diskus) 250-50 mcg/dose diskus inhaler Inhale 1 puff in the morning and at bedtime. Rinse mouth with water after use to reduce aftertaste and incidence of candidiasis. Do not swallow.       No current facility-administered medications for this visit.         Review of Systems   CONSTITUTION: no fever/chills, no fatigue, +daytime drowsiness-one nap per day, good appetite currently 2-3 meals/day, portion sizes 50% less compared to 6 months ago, + weight loss 190lbs a year ago, 04/2022 160lbs, 05/2022 165lbs, 150lbs 6/24, 158lbs today   EYES: no changes in vision  ENT: no dysphagia, no odynophagia  CV: No CP, no palpitations, no dizziness, no lightheadedness, no LE edema -wears compression stockings  RESP: no cough, no sputum, reports no SOB at rest or with communication, no orthopnea, +dyspnea on moderate exertion of 100-200 feet  GI: +reflux managed with omeprazole, no n/v, no constipation, no diarrhea, last BM today, no abdominal pain, no bowel incontinence  GU: no urinary incontinence, no dysuria, + urinary frequency, burning and urge improved and managed with Gemtesa   NEURO:  no H/A's, no neuropathy, no asymmetrical weakness, + LE weakness   MUSCULOSKELETAL: + joint stiffness and pain in back (see HPI)  PSYCH/COGNITION: reports managed depression and anxiety with Duloxetine, no sleep disturbance, no STM loss, no confusion   MOBILITY/Functional Status/ADLs: transfers and ambulates on own, cane for long distances, performs own ADLs  DIET: Boost with protein 2x/day, high protein diet    SKIN: no rash, redness/soreness to coccyx  (see HPI)     Elder Abuse Screen: Negative     PH-Q2:   Not done today      Advance Care Planning   HCP wife Laruth Bouchard primary, daughter Marella Chimes alternate; MOLST completed to reflect DNR/DNI wishes, copy in the home           Care team:  Marcelline Mates, [facility], [place]    Dr. Cliffton Asters, Oncologist, managing Prolia q59mos, Lupron injections, CLL (blood tests), next apt 6/24  Dr. Lindaann Pascal, [facility], [address] [place], apt tomorrow  Dr. Sheliah Plane, Radiation Oncologist, [facility]   Dr. Bartholomew Boards, Neurosurgeon, [facility]  Dr. Alyssa Grove, Cardiologist   Dr. Gregor Hams, Urologist    Dr. Zenda Alpers, GI   Dr. Algis Liming, Pulmonologist      PE:  BP 114/60 (BP Location: Right arm, Patient Position: Sitting)   Pulse 83   Resp 16   Wt 71.7 kg   SpO2 96%    PPS Score: 60%   CONSTITUTIONAL:  sitting upright in chair in living room, NAD, pleasant and engaged   HEENT: sclera anicteric, conjunctiva non-injected, no oral lesions  CV: RRR, no JVD, no edema with compression stockings in place   PULM: CTA, no accessory muscle use   GI:  +BS's, soft, NT/ND, no organomegaly    NEURO: A&Ox4, CN II-XII grossly intact, grip strength intact, UE strength 5/5, LE strength 5/5, speech fluent and clear    MUSCULOSKELETAL: no tenderness; no deformities   PSYCH: mood/affect appropriate; judgement and insight intact; memory and recall of recent medical events intact    SKIN: no rash, mild redness to coccyx area           LABS:  WBC   4.00 - 10.00 K/uL 8.56    RBC   4.50 - 6.40 M/uL 3.96 Low     HGB   13.5 - 18.0 g/dL 16.1 Low     HCT   09.6 - 54.0 % 36.4 Low     PLT   150 - 450 K/uL 152    MCV   80.0 - 100.0 fL 91.9    MCH   27.0 - 32.0 pg 31.3    MCHC   32.0 - 36.0 g/dL 04.5    RDW   40.9 - 81.1 % 12.0    MPV   8.4 - 12.0 fl 9.2    NRBC   0 /100 WBCs 0.00    ABSOLUTE NRBC   0 K/uL 0.00    DIFF METHOD  Auto  NEUTS   48.0 - 76.0 % 40.1 Low     LYMPHS   18.0 - 41.0 % 51.8 High     MONOS   4.0 - 11.0 % 5.8    EOS   0.0 - 5.0 % 1.2    BASOS   0.0 - 1.5 % 0.2    % IMMATURE GRANS   0.0 - 1.0 % 0.9    ABSOLUTE NEUTS   1.92 - 7.60 K/uL 3.43    ABSOLUTE LYMPHS   0.72 - 4.10 K/uL 4.43 High     ABSOLUTE MONOS   0.16 - 1.10 K/uL 0.50    ABSOLUTE EOS   0.00 - 0.50 K/uL 0.10    ABSOLUTE  BASOS   0.00 - 0.15 K/uL 0.02    ABS IMMATURE GRANS   0.00 - 0.10 K/uL 0.08    Resulting Agency [facility]      Specimen Collected: 05/26/22 10:28      SODIUM   136 - 145 mmol/L 138    POTASSIUM   3.4 - 5.1 mmol/L 4.6    CHLORIDE   98 - 107 mmol/L 105    CO2   22 - 31 mmol/L 23    BUN   6 - 23 mg/dL 20    CREATININE   1.61 - 1.20 mg/dL 0.96    GLUCOSE   70 - 100 mg/dL 96    ALBUMIN   3.5 - 5.2 g/dL 5.0    TOTAL PROTEIN   6.4 - 8.3 g/dL 6.6    CALCIUM   8.8 - 10.7 mg/dL 9.7    ALKALINE PHOSPHATASE   40 - 129 U/L 72    TOTAL BILIRUBIN   0.2 - 1.2 mg/dL 0.9    AST   <04 U/L 13    ALT   <42 U/L 6    GLOBULIN   2.3 - 4.2 g/dL 1.6 Low     EGFR   >54 mL/min/1.54m2 73    Comment: Estimated glomerular filtration rate calculated using the CKD-EPI refit equation.   ANION GAP   7 - 17 mmol/L 10    Resulting Agency [facility]      Specimen Collected: 05/26/22 10:28          IMAGING:  Lumbar MRI in 05/2022 with no new compression fractures found but with "severe neuroforaminal narrowing at L5-S1 bilaterally."            Assessment:   82 y.o. 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 Patient showing mild signs of CLL relapse, no further treatment recommended at this time and is being worked up by Pulmonology for [employer]. Symptoms currently managed with current regime albeit will discuss radiating pain with PT and orthopedic. Great results from continued PT at [place]. Pressure injury stage 1 has resurfaced on coccyx with recommendations below. Will continue to benefit from palliative care for pain symptom management and longitudinal GOC with providers.     THE PALLIATIVE CARE TEAM WILL CONTINUE TO VISIT TO PROVIDE SUPPORT FOR  PATIENT AND FAMILY. FOR IMMEDIATE NEEDS, PT WILL CONTACT [facility], OR 911 AS PALLIATIVE CARE DOES NOT PROVIDE EMERGENCY CARE.     PLAN:  CLL: In relapse since 2022, showing mild symptoms of disease progression  in 6.2024 with blood test surveillance q22mos with hem/onc Dr. Laveda Norman, last apt 07/2022.   Prostate CA: Completed XRT treatment in 02/2022. Continues on Lupron injections with Dr. Laveda Norman for next three years and Casodex 50mg  daily. Followed by Dr. Ezzie Dural [facility].   Pain:  Continue MS Contin 15mg  q12h and Oxycodone 10mg  q6h PRN for breakthrough and Tylenol 1000mg  BID PRN. Prolia q1months for bone building with Dr. Laveda Norman. [facility] with [facility]. Lindaann Pascal for thoracic and lumbar cortisone shots with follow up tomorrow.    Functional Decline: Continues with PT at [facility] at twice per week. Encouraged to discuss radiating pain with PT with certain exercises as to not aggravate.   Weight Loss: 16% weight loss over last year. Nutritionist referral in place. Encouraged protein supplements BID and small frequent meals.   Adjustment Depression with chronic pain: Continue with Duloxetine 30mg  QD.   Anxiety: Continue with Valium 2mg  QD PRN for severe anxiety or acute severe muscle spasms associated with intractable back pain.   Pressure Injury: Stage 1. Recommend daily continued use of barrier cream. Offloading every couple of hours with recliner and showed cushion options on [employer]. Encouraged to report if area opens to restart VNA for wound care.   DOE: Started on [place] 2 puffs daily. Work up with pulmonology pending for chest CT and PFTs.     Next palliative apt made for 8/29 at 11am.      Total time of this visit encounter: 60 minutes, with the time spent on pre-visit chart review, face to face time with the patient, discussion and counseling with the patient and family, review of the case with the care team, and documentation in the medical record .

## 2022-09-17 MED ORDER — morphine CR (MS Contin) 15 mg 12 hr tablet
15 | ORAL_TABLET | Freq: Two times a day (BID) | ORAL | 0 refills | Status: DC
Start: 2022-09-17 — End: 2022-10-20

## 2022-09-17 NOTE — Progress Notes (Signed)
Medication Refill    Medications refilled per patient request for Morphine CR 15mg  BID    I am prescribing this medication for the management of symptoms related to cancer related bone loss with intractable back pain.     Activities of Daily Living: This medication allows for symptom relief, allowing the patient to continue performing their activities of daily living without impairment.    Adverse events: The patient is not suffering from any adverse events.    Aberrant drug-taking behavior: The patient is not displaying any aberrant drug-taking behavior.      Reviewed instructions for proper use and safe storage of medications.     [employer] reviewed.    Patient advised the NO REFILLS after hours, on Holidays or on weekends. NO EARLY REFILLS including for lost, destroyed or missing opiates or other controlled medications.      Narcan prescription and instructions regarding proper use in an emergency provided/previously provided to patient.

## 2022-09-27 NOTE — Telephone Encounter (Signed)
Prescreen call attempted. Spoke to dtr. who states both pt. and spouse are napping. Dtr. Took this scheduler's number and will ask pt/spouse to call when they wake up.

## 2022-09-27 NOTE — Progress Notes (Signed)
Is the patient appropriate for Home Care?     Homebound Status: ***  Covid status: ***  Insurance carrier: Payor: MEDICARE / Plan: MEDICARE PART A AND B / Product Type: Medicare /     Date of potential discharge: ***    Is there a next day need?     Does the patient have a PCP?     Is the patient a [facility] patient?    Does patient use Home O2    Have your cordinated with the IP care team on the patients IV?   Have your coordinated with the IP care team on the patients Drains?   Have your coordinated with the IP care team on the patients Wounds?

## 2022-09-28 NOTE — Telephone Encounter (Signed)
I am calling in regard to a referral for home health services we received from [facility].    I would like to confirm your address: [address] [place] [address]   Address confirmed: Yes    I would like to confirm your best contact number: [phone]   Contact number confirmed: Yes    Do you have any other home care services or agencies coming into your home?    Other agency present: No    Do you have another nurse or physical therapist visiting you at home?    Other staff present: No    Do you have an appointment scheduled with your doctor in the next 7 days?  MD follow-up appointment: No - Comment: will call md office today    Can I confirm that Marcelline Mates, NP is your primary care physician?   PCP confirmed: Yes    If there are pets in the home, please ensure that your pet is in another room during the clinicians' home visits.     If there are weapons in the home, they need to be locked up and secured. If not, the clinician will need to leave the home.     Is anyone in the home experiencing flu-like symptoms or respiratory symptoms?    Flu-like symptoms: No    Did you have any new or changed medications that need to be picked up at the pharmacy?    New medications: N/A    Were you able to pick them up?    All medications in the home: N/A    Do you have the oxygen in your home?    Have oxygen at home: No    Do you have any supplies you need until our staff comes to your home?    Need supplies immediately: N/A    Could you please make your insurance card available for visit?    Could you please have all your medications available for your visit, both your prescriptions and any over-the-counter meds you are taking?    The services ordered for you are: Nursing and Physical Therapy    The team caring for you is LAW. I am the scheduler for you and my name is or the scheduler for you is Elyse. My/their return number is [phone]. Please feel free to call me back if there is anything I can help with.     Comments:  none

## 2022-09-29 ENCOUNTER — Encounter

## 2022-09-29 NOTE — Telephone Encounter (Signed)
VO obtained to delay SOC until 10/04/22 per wife's request.

## 2022-09-30 ENCOUNTER — Encounter

## 2022-10-04 NOTE — Telephone Encounter (Signed)
Pt started outpt phys therapy and will not be taken for care.

## 2022-10-04 NOTE — Telephone Encounter (Signed)
Call to wife, states pt has started outpt phys therapy and does not need VNA services.

## 2022-10-04 NOTE — Telephone Encounter (Signed)
Call to wife to discuss scheduling SOC eval, no answer. Message left with return number for [facility]

## 2022-10-20 MED ORDER — DULoxetine (Cymbalta) 30 mg DR capsule
30 | ORAL_CAPSULE | Freq: Every day | ORAL | 2 refills | Status: AC
Start: 2022-10-20 — End: ?

## 2022-10-20 MED ORDER — morphine CR (MS Contin) 15 mg 12 hr tablet
15 | ORAL_TABLET | Freq: Two times a day (BID) | ORAL | 0 refills | Status: DC
Start: 2022-10-20 — End: 2022-11-18

## 2022-10-20 NOTE — Progress Notes (Signed)
Medication Refill    Medications refilled per patient request for Morphine 15mg  q12h     I am prescribing this medication for the management of symptoms related to severe back pain     Activities of Daily Living: This medication allows for symptom relief, allowing the patient to continue performing their activities of daily living without impairment.    Adverse events: The patient is not suffering from any adverse events.    Aberrant drug-taking behavior: The patient is not displaying any aberrant drug-taking behavior.      Reviewed instructions for proper use and safe storage of medications.     [employer] reviewed.    Patient advised the NO REFILLS after hours, on Holidays or on weekends. NO EARLY REFILLS including for lost, destroyed or missing opiates or other controlled medications.      Narcan prescription and instructions regarding proper use in an emergency provided/previously provided to patient.

## 2022-10-25 NOTE — Telephone Encounter (Signed)
[name] [phone]             LVM to confirm appt for 08/29 at 11am

## 2022-10-26 NOTE — Telephone Encounter (Signed)
Bill's wife called back and confirmed appt for tomorrow at 11am

## 2022-10-27 ENCOUNTER — Encounter: Admit: 2022-10-27 | Payer: MEDICARE | Attending: Adult Health

## 2022-10-27 DIAGNOSIS — Z7189 Other specified counseling: Secondary | ICD-10-CM

## 2022-10-27 NOTE — Unmapped (Signed)
Palliative Care  [address]  [place], [place] [address]  Dept: [phone]     Chief Complaint: Exploration of Treatment Options, CLL, Prostate CA, Pain, Functional Decline, Weight loss, Depression, Anxiety, Pressure Injury stage 1, DOE, LE edema       Reason for visit: Symptom Management     Place of service: Home, spouse Darel Hong     Type of visit (new/established): Established        BJY:NWGNFAO E Garlon Kerbo  82 y.o. 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 Robert Bellow with LBP improvement but with persistent right thigh pain. Then with increasing difficulty lying flat for XRT for prostate CA and increasing pain, found more compression and underwent T10-T11 kyphoplasty/biopsy with Dr Bartholomew Boards on 12/20. 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 In 01/2022 found with severe debilitating thoracolumbar pain below area of T10/T11 found no longer to be surgical candidate with recommendation to follow with palliative care for pain management while pursuing CA treatment and continued CLL surveillance. Today, patient being seen for follow up palliative care visit.     At time of this visit, patient with recent fall on 7/29 found to have fracture of the manubrium with mild displacement with associated soft tissue swelling of the sternal cleidomastoid muscle bilaterally with no significant mediastinal hematoma. Patient was inpatient for an overnight and then with ability to transfer and ambulate next day was d/c'd home with follow up with PCP and oxycodone 10mg  PRN as patient is on  long acting Morphine CR 15mg  q12h. Patient has not required break through Oxycodone and notes he resumed outpatient PT with [facility] a couple times per week. He just got clearance this week to start on upper body strength <10lb. He denies back pain today feels it has not been issue since sternum pain and that pain continues to improve with 1-2/10 continuous pain at top of sternum localized that can exacerbate to moderate with too much up and down movement above chest level. Patient notes he has been walking around his apartment complex 3x/week on the days he does not do PT. He has also been with some lower right buttocks pain with imaging done this past Monday showing loss of cartilage with recommendations for Tylenol, heating pad and offloading. He is already doing heating pad and offloading and with some redness to coccyx has been applying barrier cream daily and reports no open areas.     He is with recent PCP apt and with resistant LE edema despite compression stockings and elevation, he has been started on Lasix 10mg  daily. He is with compression stockings today with c/o bilateral pedal edema. He watches BP and is below 100/50, he is advised not to take Lasix which has not happened. He is also anticipating CT chest imaging and PFT's next week ordered by Pulmonologist. He continues to report dyspnea on exertion and sometimes with deep breathes feels like chest "catches and have some pain."  He notes appetite and intake have been good and he is with weight gain, today at 167lbs.      Patient sees Dr. Laveda Norman for surveillance of  CLL with next apt on 9/19, notes at last appointment, wbc slightly trending up which could signal some CLL disease progression but will continue to monitor and no indications for treatment or intervention at this time.       Allergies   Allergen Reactions    Penicillins Rash     Other reaction(s): Not available         Current Outpatient Medications   Medication Sig Dispense Refill     acetaminophen (Tylenol) 500 mg tablet Take by mouth every 6 (six) hours if needed. 2 tabs BID PRN       alfuzosin (Uroxatral) 10 mg 24 hr tablet Take 10 mg by mouth at noon and 10 mg in the evening. Indications: enlarged prostate with urination problem      ascorbic acid (Vitamin C) 500 mg tablet Take 500 mg by mouth once daily. Indications: inadequate vitamin C      aspirin 81 mg EC tablet Take 81 mg by mouth once daily.      bicalutamide (Casodex) 50 mg chemo tablet Take 50 mg by mouth once daily. Indications: advanced form of prostate cancer      calcium carbonate 600 mg calcium (1,500 mg) tablet Take 1,500 mg by mouth once daily. Indications: osteoporosis, a condition of weak bones      cholecalciferol (D3-5) 5,000 Units tablet Take 5,000 Units by mouth once daily. Indications: prevention of vitamin D deficiency      diazePAM (Valium) 2 mg tablet Take 1 tablet (2 mg) by mouth if needed each day (prior to radiation procedure). 30 tablet 0    DILTIAZEM HCL ORAL Take 120 mg by mouth 1 (one) time each day. Indications: .      docusate sodium (Colace) 100 mg tablet Take 100 mg by mouth if needed in the morning and at bedtime for constipation. Indications: constipation      DULoxetine (Cymbalta) 30 mg DR capsule Take 1 capsule (30 mg) by mouth once daily. Do not crush or chew. 30 capsule 2    fluticasone propion-salmeteroL (Advair Diskus) 250-50 mcg/dose diskus inhaler Inhale 1 puff in the morning and at bedtime. Rinse mouth with water after use to reduce aftertaste and incidence of candidiasis. Do not swallow.      furosemide (Lasix) 20 mg tablet Take 10 mg by mouth. 1/2 tab daily      gabapentin (Neurontin) 300 mg capsule Take 300 mg by mouth at bedtime.      morphine CR (MS Contin) 15 mg 12 hr tablet Take 1 tablet (15 mg) by mouth every 12 (twelve) hours. Do not crush, chew, or split. 60 tablet 0    multivitamin tablet Take 1 tablet by mouth once daily. Indications: treatment to prevent vitamin deficiency       naloxone (Narcan) 4 mg/0.1 mL nasal spray Administer 1 spray (4 mg) into affected nostril(s) if needed for opioid reversal. May repeat every 2-3 minutes if needed, alternating nostrils, until medical assistance becomes available. 1 each 0    omeprazole (PriLOSEC) 20 mg DR capsule Take 20 mg by mouth before breakfast. Do not crush or chew.      oxyCODONE (Roxicodone) 10 mg immediate release tablet Take 10 mg by mouth every 6 (six) hours if needed for pain score 4-6. Indications: pain      polyethylene glycol (Glycolax) 17 gram packet Take 17 g by mouth twice daily.      sennosides (senna) 8.6 mg tablet Take 1 tablet by mouth once daily.  vibegron (Gemtesa) 75 mg tablet tablet Take 150 mg by mouth once daily.       No current facility-administered medications for this visit.       Review of Systems   CONSTITUTION: no fever/chills, no fatigue, +daytime drowsiness-one nap per day, good appetite currently 2-3 meals/day, portion sizes 50% less compared to 6 months ago, + weight loss 190lbs a year ago, 04/2022 160lbs, 05/2022 165lbs, 150lbs 6/24, 158lbs 07/2022, 167lbs today   EYES: no changes in vision    ENT: no dysphagia, no odynophagia  CV: No CP, no palpitations, no dizziness, no lightheadedness, + pedal LE edema -wears compression stockings  RESP: no cough, no sputum, reports no SOB at rest or with communication, no orthopnea, +dyspnea on moderate exertion of 100-200 feet (see HPI)   GI: +reflux managed with omeprazole, no n/v, no constipation, no diarrhea, last BM today, no abdominal pain, no bowel incontinence  GU: no urinary incontinence, no dysuria, + urinary frequency, burning and urge improved and managed with Gemtesa   NEURO:  no H/A's, no neuropathy, no asymmetrical weakness, + LE weakness   MUSCULOSKELETAL: + joint stiffness and pain in back (see HPI)  PSYCH/COGNITION: reports managed depression and anxiety with Duloxetine, no sleep disturbance, no STM loss, no confusion   MOBILITY/Functional Status/ADLs:  transfers and ambulates on own, cane and walking sticks for long distances, performs own ADLs  DIET: occasional Boost with protein 2x/day, high protein diet    SKIN: no rash, redness to coccyx applies barrier cream daily    Elder Abuse Screen: Negative     PH-Q2:   Not done today       Advance Care Planning   HCP wife Laruth Bouchard primary, daughter Marella Chimes alternate; MOLST completed to reflect DNR/DNI wishes, copy in the home      Care team:  Marcelline Mates, NP PCP, [facility], [place]    Dr. Cliffton Asters, Oncologist, managing Prolia q3mos, Lupron injections, CLL (blood tests), next apt 9/19   Dr. Lindaann Pascal, [facility], [address] [place]  Dr. Sheliah Plane, Radiation Oncologist, [facility]   Dr. Bartholomew Boards, Neurosurgeon, [facility]  Dr. Alyssa Grove, Cardiologist   Dr. Gregor Hams, Urologist    Dr. Zenda Alpers, GI   Dr. Robina Ade PA-C, Pulmonologist, [facility]     PE:  BP 118/60 (BP Location: Right arm, Patient Position: Sitting)   Pulse 80   Resp 18   Wt 75.8 kg   SpO2 (!) 93%    PPS Score: 60%  CONSTITUTIONAL:  sitting upright in chair in living room, NAD, pleasant and engaged   HEENT: sclera anicteric, conjunctiva non-injected, no oral lesions  CV: RRR, no JVD, +pedal bilateral LE edema with compression stockings in place   PULM: CTA, no accessory muscle use   GI:  +BS's, soft, NT/ND, no organomegaly    NEURO: A&Ox4, CN II-XII grossly intact, grip strength intact, UE strength 5/5, LE strength 5/5, speech fluent and clear    MUSCULOSKELETAL: +mild tenderness to palpation of upper sternum; no deformities   PSYCH: mood/affect appropriate; judgement and insight intact; memory and recall of recent medical events intact    SKIN: no rash, mild redness to coccyx area not visualized       LABS:  SODIUM  136 - 145 mmol/L 139   POTASSIUM  3.4 - 5.1 mmol/L 4.1   CHLORIDE  98 - 107 mmol/L 101   CO2  22 - 31 mmol/L 27   BUN  6 -  23 mg/dL 20   CREATININE  5.95 - 1.20 mg/dL 6.38    GLUCOSE  70 - 756 mg/dL 97   ALBUMIN  3.5 - 5.2 g/dL 4.6   TOTAL PROTEIN  6.4 - 8.3 g/dL 6.1 Low    CALCIUM  8.8 - 10.7 mg/dL 43.3   ALKALINE PHOSPHATASE  40 - 129 U/L 64   TOTAL BILIRUBIN  0.2 - 1.2 mg/dL 0.6   AST  <29 U/L 11   ALT  <42 U/L 8   GLOBULIN  2.3 - 4.2 g/dL 1.5 Low    EGFR  >51 OA/CZY/6.06T0 70   Comment: Estimated glomerular filtration rate calculated using the CKD-EPI refit equation.   ANION GAP  7 - 17 mmol/L 11   Resulting Agency [facility]     Specimen Collected: 08/22/22 12:26     ntains abnormal data CBC and differential  Order: [id number]  Component  Ref Range & Units 2 mo ago   WBC  4.00 - 10.00 K/uL 11.25 High    RBC  4.50 - 6.40 M/uL 3.36 Low    HGB  13.5 - 18.0 g/dL 55.7 Low    HCT  32.2 - 54.0 % 31.6 Low    PLT  150 - 450 K/uL 121 Low    MCV  80.0 - 100.0 fL 94.0   MCH  27.0 - 32.0 pg 32.4 High    MCHC  32.0 - 36.0 g/dL 02.5   RDW  42.7 - 06.2 % 12.4   MPV  8.4 - 12.0 fl 9.8   NRBC  0 /100 WBCs 0.00   ABSOLUTE NRBC  0 K/uL 0.00   DIFF METHOD Auto   NEUTS  48.0 - 76.0 % 28.5 Low    LYMPHS  18.0 - 41.0 % 63.2 High    MONOS  4.0 - 11.0 % 7.0   EOS  0.0 - 5.0 % 0.3   BASOS  0.0 - 1.5 % 0.3   % IMMATURE GRANS  0.0 - 1.0 % 0.7   ABSOLUTE NEUTS  1.92 - 7.60 K/uL 3.21   ABSOLUTE LYMPHS  0.72 - 4.10 K/uL 7.11 High    ABSOLUTE MONOS  0.16 - 1.10 K/uL 0.79   ABSOLUTE EOS  0.00 - 0.50 K/uL 0.03   ABSOLUTE BASOS  0.00 - 0.15 K/uL 0.03   ABS IMMATURE GRANS  0.00 - 0.10 K/uL 0.08   Resulting Agency [facility]     Specimen Collected: 08/22/22 12:26    Performed by: [facility]         IMAGING:  ORDER #: [id number] CT/CT chest w con  IMPRESSION: There is a fracture of the manubrium with mild displacement. There  is associated soft tissue swelling of  the sternal cleidomastoid muscles bilaterally. No significant mediastinal  hematoma can be identified at this time. Note  is made of extensive lymphadenopathy in the neck chest and upper abdomen.  This  patient with known history of CLL.        Assessment:   82 y.o. 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 Patient showing mild signs of CLL relapse, no further treatment recommended at this time and is being worked up by Pulmonology for [employer]. Newly started on low dose diuretic  for persistent LE edema. Symptoms of pain currently managed s/p fall with fracture of manubrium and has resumed PT at [facility] for UE and LE strength. Will continue to benefit from palliative care for pain symptom management and longitudinal GOC in collaboration with providers.     THE PALLIATIVE CARE TEAM WILL CONTINUE TO VISIT TO PROVIDE SUPPORT FOR PATIENT AND FAMILY. FOR IMMEDIATE NEEDS, PT WILL CONTACT [facility], OR 911 AS PALLIATIVE CARE DOES NOT PROVIDE EMERGENCY CARE.     PLAN:  CLL: In relapse since 2022, showing mild symptoms of disease progression in 6.2024 with blood test surveillance q69mos with hem/onc Dr. Laveda Norman, last apt 07/2022.   Prostate CA: Completed XRT treatment in 02/2022. Continues on Lupron injections with Dr. Laveda Norman for next three years and Casodex 50mg  daily. Followed by Dr. Ezzie Dural [facility].   Pain:  Continue MS Contin 15mg  q12h, Gabapentin 300mg  QHS, and Oxycodone 10mg  q6h PRN for breakthrough and Tylenol 1000mg  BID PRN. Prolia q50months for bone building with Dr. Laveda Norman. Follows [facility] with Dr. Lindaann Pascal for thoracic and lumbar cortisone shots.   Functional Decline: Continues with PT at [facility] at twice per week.   Weight Loss: 16% weight loss over last year recently with weight gain and maintenance of weight with good appetite and intake. Encourage protein supplements BID and small frequent meals.    Depression with chronic pain: Continue with Duloxetine 30mg  QD.   Anxiety: Continue with Valium 2mg  QD PRN for severe anxiety or acute severe muscle spasms associated with intractable back pain. He has not required.   Pressure Injury: Stage 1. Continued use of barrier cream. Offloading every couple of hours with recliner. Encouraged to report if area opens.   DOE: Continue with Advair Diskus 1 puff 2x daily. Work up with pulmonology pending for chest CT and PFTs, delayed due to recent fall, scheduled for next week.   LE Edema: Continue with Lasix 10mg  daily. Rx'd by PCP and monitoring BMP/kidney function, as well as, BPs and knows parameters not to take if BP <100/50.      Next palliative apt made for 10/3 at 11am.      Total time of this visit encounter: 60 minutes, with the time spent on pre-visit chart review, face to face time with the patient, discussion and counseling with the patient and family, review of the case with the care team, and documentation in the medical record .

## 2022-11-17 LAB — CMP (EXT)
ALT/SGPT (EXT): 7 U/L (ref <42)
AST/SGOT (EXT): 16 U/L (ref <41)
Albumin (EXT): 4.6 g/dL (ref 3.5–5.2)
Alkaline Phosphatase (EXT): 111 U/L (ref 40–129)
Anion Gap (EXT): 13 mmol/L (ref 7–17)
BUN (EXT): 19 mg/dL (ref 6–23)
Bilirubin, Total (EXT): 0.7 mg/dL (ref 0.2–1.2)
CO2 (EXT): 26 mmol/L (ref 22–31)
CalciumCalcium (EXT): 9.6 mg/dL (ref 8.8–10.7)
Chloride (EXT): 100 mmol/L (ref 98–107)
Creatinine (EXT): 1.23 mg/dL — ABNORMAL HIGH (ref 0.50–1.20)
GFR Estimated (Calc) (EXT): 59 mL/min/{1.73_m2} — ABNORMAL LOW (ref >59)
Globulin (EXT): 1.6 g/dL — ABNORMAL LOW (ref 2.3–4.2)
Glucose (EXT): 104 mg/dL — ABNORMAL HIGH (ref 70–100)
Potassium (EXT): 3.9 mmol/L (ref 3.4–5.1)
Protein (EXT): 6.2 g/dL — ABNORMAL LOW (ref 6.4–8.3)
Sodium (EXT): 139 mmol/L (ref 136–145)

## 2022-11-18 MED ORDER — morphine CR (MS Contin) 15 mg 12 hr tablet
15 | ORAL_TABLET | Freq: Two times a day (BID) | ORAL | 0 refills | Status: DC
Start: 2022-11-18 — End: 2022-12-21

## 2022-11-18 NOTE — Progress Notes (Signed)
Medication Refill    Medications refilled per patient request for Morphine CR 15mg  BID    I am prescribing this medication for the management of symptoms related to severe back pain     Activities of Daily Living: This medication allows for symptom relief, allowing the patient to continue performing their activities of daily living without impairment.    Adverse events: The patient is not suffering from any adverse events.    Aberrant drug-taking behavior: The patient is not displaying any aberrant drug-taking behavior.      Reviewed instructions for proper use and safe storage of medications.     [place] reviewed.    Patient advised the NO REFILLS after hours, on Holidays or on weekends. NO EARLY REFILLS including for lost, destroyed or missing opiates or other controlled medications.      Narcan prescription and instructions regarding proper use in an emergency provided/previously provided to patient.

## 2022-11-24 NOTE — Telephone Encounter (Signed)
Called [name] Stable [phone]                   Called LVM Diannia Ruder had set up an appt for 10/03 at 11am but she had already been booked that day, reached out to see if wants Friday 10/04 at 330?  Waiting for a call back

## 2022-11-25 NOTE — Telephone Encounter (Signed)
Bill called back and appt for the follow up for Palliative was set for 10/04 at 330

## 2022-12-01 NOTE — Telephone Encounter (Signed)
[phone] Matthew Werner  Confirming appt for tomorrow 10/04 at 330

## 2022-12-02 ENCOUNTER — Encounter: Admit: 2022-12-02 | Payer: MEDICARE | Attending: Adult Health

## 2022-12-02 DIAGNOSIS — Z7189 Other specified counseling: Secondary | ICD-10-CM

## 2022-12-02 NOTE — Unmapped (Signed)
Palliative Care  [address]  [place]  [place], [place] [address]  Dept: [phone]     Chief Complaint: Exploration of Treatment Options, CLL, Prostate CA, Pain, Functional Decline, Weight loss, Depression, Anxiety, Pressure Injury stage 1, DOE, LE edema, Left hip pain, Right Pleural Effusion        Reason for visit: Symptom Management, GOC     Place of service: [facility], spouse Darel Hong present     Type of visit (new/established): Established        TDD:UKGURKY E Ahamad Summar  82 y.o. 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 Robert Bellow with LBP improvement but with persistent right thigh pain. Then with increasing difficulty lying flat for XRT for prostate CA and increasing pain, found more compression and underwent T10-T11 kyphoplasty/biopsy with Dr Bartholomew Boards on 12/20. 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 In 01/2022 found with severe debilitating thoracolumbar pain below area of T10/T11 found no longer to be surgical candidate with recommendation to follow with palliative care for pain management while pursuing CA treatment and continued CLL surveillance. Today, patient being seen for follow up palliative care visit.      Since last palliative visit, patient seeing pulmonology for increasing dyspnea with exertion receiving PFTs and chest imaging. Chest imaging about a month ago showing R sided moderate pleural effusion and with increasing LE edema and weight gain, PCP and pulmonology deciding to increase Lasix to 20mg  daily and repeat chest imaging. Patient receiving follow chest  imaging results today showing improvement with mild right sided pleural effusion. Patient continues on WIxela maintenance inhaler BID and Albuterol inhaler as needed but notes he does not require rescue inhaler and noticed he is with improved dyspnea on exertion from 100-200 feet to walks around his neighborhood. He unfortunately had a recent bout of oral thrush despite rinsing after inhaler use which is now resolved with Nystatin swish and swallow. Patient also noted with LE edema which has improved d/t Lasix increase and wearing  compression stockings daily. In addition, baker cyst drained from back of right knee also improving RLE edema. Fluid weight is down and maintained between 158-160lbs. He is also anticipating routine ECHO in upcoming weeks with cardiology.      Chest imaging showing he continues to heal from fracture of manubrium and participates in PT at [facility] for UE strength with no more than 5-10lbs and pool work. Reports pain level 0/10 at rest today and worst pain level over 24 hours at 5/10 currently felt in thoracic back after prolonged movement. He does continue to walk daily with his walking sticks and has been participating in PT at [place] 2x/week which coming to an end. He reports he is going to try to keep maintenance program up in pool at [place]. Patient continues on Morphine CR 15mg  q12h without need for Oxycodone breakthrough. He has added Gabapentin 300mg  QHS back in with newer issues discussed with Orthopedic. 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 He does not feel any pain in left knee. Over  last week, noticing increased pain in left hip that occasionally radiates down left leg. He is anticipating orthopedic follow up this week and left hip imaging. He states when on walk today left hip worsened with more activity and decided to rest.     He met with Dr. Laveda Norman with surveillance on CLL with notes indicating  mild relapse progression with "lymphocytosis and leukocytosis and abnormal bone marrow signal intensity; he has no anemia, thrombocytopenia (borderline low platelets now), or B symptoms; however, he has hypogammaglobulinemia, lymphadenopathy, splenomegaly and presence of chronic lymphocytic leukemia in spine samples (kyphoplasty and laminectomy specimens)." Treatment is not indicated at this time and patient indicating he most likely will never require treatment. He continues to receive hormonal treatment with Lupron for 3 years with Dr. Laveda Norman with next injection due in December.     Allergies   Allergen Reactions    Penicillins Rash     Other reaction(s): Not available         Current Outpatient Medications   Medication Sig Dispense Refill    acetaminophen (Tylenol) 500 mg tablet Take by mouth every 6 (six) hours if needed. 2 tabs BID PRN       alfuzosin (Uroxatral) 10 mg 24 hr tablet Take 10 mg by mouth at noon and 10 mg in the evening. Indications: enlarged prostate with urination problem      ascorbic acid (Vitamin C) 500 mg tablet Take 500 mg by mouth once daily. Indications: inadequate vitamin C      aspirin 81 mg EC tablet Take 81 mg by mouth once daily.      bicalutamide (Casodex) 50 mg chemo tablet Take 50 mg by mouth once daily. Indications: advanced form of prostate cancer      calcium carbonate 600 mg calcium (1,500 mg) tablet Take 1,500 mg by mouth once daily. Indications: osteoporosis, a condition of weak bones      cholecalciferol (D3-5) 5,000 Units tablet Take 5,000 Units by mouth once daily. Indications: prevention of vitamin D deficiency      diazePAM (Valium) 2 mg tablet Take 1 tablet (2 mg) by mouth if needed each day (prior to radiation procedure). 30 tablet 0    DILTIAZEM HCL ORAL Take 120 mg by mouth 1 (one) time each day. Indications: .      docusate sodium (Colace) 100 mg tablet Take 100 mg by mouth if needed in the morning and at bedtime for constipation. Indications: constipation       DULoxetine (Cymbalta) 30 mg DR capsule Take 1 capsule (30 mg) by mouth once daily. Do not crush or chew. 30 capsule 2    fluticasone propion-salmeteroL (Advair Diskus) 100-50 mcg/dose diskus inhaler Inhale 1 puff in the morning and at bedtime. Rinse mouth with water after use to reduce aftertaste and incidence of candidiasis. Do not swallow.      furosemide (Lasix) 20 mg tablet Take 20 mg by mouth. 1/2 tab daily      gabapentin (Neurontin) 300 mg capsule Take 300 mg by mouth at bedtime.      morphine CR (MS Contin) 15 mg 12 hr tablet Take 1 tablet (15 mg) by mouth every 12 (twelve) hours. Do not crush, chew, or split. 60 tablet 0    multivitamin tablet Take 1 tablet by mouth once daily. Indications: treatment to prevent vitamin deficiency      naloxone (Narcan) 4 mg/0.1 mL nasal spray Administer 1 spray (4 mg) into affected nostril(s) if needed for opioid reversal. May repeat every  2-3 minutes if needed, alternating nostrils, until medical assistance becomes available. 1 each 0    omeprazole (PriLOSEC) 20 mg DR capsule Take 20 mg by mouth before breakfast. Do not crush or chew.      oxyCODONE (Roxicodone) 10 mg immediate release tablet Take 10 mg by mouth every 6 (six) hours if needed for pain score 4-6. Indications: pain      polyethylene glycol (Glycolax) 17 gram packet Take 17 g by mouth twice daily.      sennosides (senna) 8.6 mg tablet Take 1 tablet by mouth once daily.      vibegron (Gemtesa) 75 mg tablet tablet Take 150 mg by mouth once daily.       No current facility-administered medications for this visit.       Review of Systems   CONSTITUTION: no fever/chills, no fatigue, +daytime drowsiness-one nap per day, decent appetite currently 2-3 meals/day, portion sizes 25-50% less compared to 6 months ago, + weight loss 190lbs a year ago, 04/2022 160lbs, 05/2022 165lbs, 150lbs 6/24, 158lbs 07/2022, 167lbs 10/2022, 160lbs today   EYES: no changes in vision    ENT: no dysphagia, no odynophagia  CV: No CP, no  palpitations, no dizziness, no lightheadedness, + pedal LE edema -wears compression stockings  RESP: no cough, no sputum, reports no SOB at rest or with communication, no orthopnea, +dyspnea on exertion improved (see HPI)   GI: +reflux managed with omeprazole, no n/v, no constipation, no diarrhea, last BM today, no abdominal pain, no bowel incontinence  GU: no urinary incontinence, no dysuria, + urinary frequency, burning and urge improved and managed with Gemtesa   NEURO:  no H/A's, no neuropathy, no asymmetrical weakness, + LE weakness   MUSCULOSKELETAL: + joint stiffness and pain in back and left hip (see HPI)  PSYCH/COGNITION: reports managed depression and anxiety with Duloxetine, no sleep disturbance, no STM loss, no confusion   MOBILITY/Functional Status/ADLs: transfers and ambulates on own, cane and walking sticks for long distances, performs own ADLs  DIET: occasional Boost with protein 2x/day, high protein diet    SKIN: no rash, redness to coccyx applies barrier cream daily     Elder Abuse Screen: Negative     PH-Q2:   Not performed today       Advance Care Planning   HCP wife Laruth Bouchard primary, daughter Marella Chimes alternate; MOLST completed to reflect DNR/DNI wishes, copy in the home      Care team:  Marcelline Mates, NP PCP, [facility], [place]    Dr. Cliffton Asters, Oncologist, managing Prolia q65mos, Lupron injections, CLL (blood tests)  Dr. Lindaann Pascal, [facility], [address] [place]  Dr. Sheliah Plane, Radiation Oncologist, [facility]   Dr. Bartholomew Boards, Neurosurgeon, [facility]  Dr. Alyssa Grove, Cardiologist   Dr. Gregor Hams, Urologist    Dr. Zenda Alpers, GI   Dr. Robina Ade PA-C, Pulmonologist, [facility]     PE:  BP 116/62 (BP Location: Right arm, Patient Position: Sitting)   Pulse 80   Resp 18   Wt 72.6 kg   SpO2 96%    PPS Score: 60%  CONSTITUTIONAL: sitting upright in chair in living room, NAD, pleasant and engaged   HEENT: sclera anicteric,  conjunctiva non-injected, no oral lesions  CV: RRR, no JVD, no edema with compression stockings in place   PULM: CTA, no accessory muscle use   GI:  +BS's, soft, NT/ND, no organomegaly    NEURO: A&Ox4, CN II-XII grossly intact, grip strength intact,  UE strength 5/5, LE strength 5/5, speech fluent and clear    MUSCULOSKELETAL: no tenderness to sternum; no deformities   PSYCH: mood/affect appropriate; judgement and insight intact; memory and recall of recent medical events intact    SKIN: no rash, mild redness to coccyx area not visualized with applied barrier cream       LABS:  Contains abnormal data CBC and differential  Order: [id number]  Component  Ref Range & Units 2 wk ago   WBC  4.00 - 10.00 K/uL 9.63   RBC  4.50 - 6.40 M/uL 3.40 Low    HGB  13.5 - 18.0 g/dL 71.6 Low    HCT  96.7 - 54.0 % 32.2 Low    PLT  150 - 450 K/uL 144 Low    MCV  80.0 - 100.0 fL 94.7   MCH  27.0 - 32.0 pg 31.5   MCHC  32.0 - 36.0 g/dL 89.3   RDW  81.0 - 17.5 % 12.2   MPV  8.4 - 12.0 fl 10.0   NRBC  0 /100 WBCs 0.00   ABSOLUTE NRBC  0 K/uL 0.00   NEUTS (MANUAL)  48.0 - 76.0 % 28.0 Low    LYMPHS  18.0 - 41.0 % 65.0 High    MONOS  4.0 - 11.0 % 5.0   EOSINOPHIL  0.0 - 5.0 % 2.0   ABSOLUTE NEUTS  1.92 - 7.60 K/uL 2.70   ABSOLUTE LYMPHS  0.72 - 4.10 K/uL 6.26 High    ABSOLUTE MONOS  0.16 - 1.10 K/uL 0.48   ABSOLUTE EOS  0.00 - 0.50 K/uL 0.19   DIFF METHOD AUTOMATED   Resulting Agency [facility]     Specimen Collected: 11/17/22 08:36    Performed by: [facility]      Contains abnormal data Comprehensive metabolic panel  Order: [id number]  Component  Ref Range & Units 2 wk ago   SODIUM  136 - 145 mmol/L 139   POTASSIUM  3.4 - 5.1 mmol/L 3.9   CHLORIDE  98 - 107 mmol/L 100   CO2  22 - 31 mmol/L 26   BUN  6 - 23 mg/dL 19   CREATININE  8.24 - 1.20 mg/dL 2.35 High    GLUCOSE  70 - 100 mg/dL 361 High    ALBUMIN  3.5 - 5.2 g/dL 4.6   TOTAL PROTEIN  6.4 - 8.3 g/dL 6.2 Low    CALCIUM  8.8 - 10.7 mg/dL 9.6   ALKALINE  PHOSPHATASE  40 - 129 U/L 111   TOTAL BILIRUBIN  0.2 - 1.2 mg/dL 0.7   AST  <44 U/L 16   ALT  <42 U/L 7   GLOBULIN  2.3 - 4.2 g/dL 1.6 Low    EGFR  >31 VQ/MGQ/6.76P9 59 Low    Comment: Estimated glomerular filtration rate calculated using the CKD-EPI refit equation.   ANION GAP  7 - 17 mmol/L 13   Resulting Agency [facility]     Specimen Collected: 11/17/22 08:36       IMAGING:  ORDER #: [id number] CT/CT chest w con  IMPRESSION: There is a fracture of the manubrium with mild displacement. There  is associated soft tissue swelling of  the sternal cleidomastoid muscles bilaterally. No significant mediastinal  hematoma can be identified at this time. Note  is made of extensive lymphadenopathy in the neck chest and upper abdomen. This  patient with known history of CLL.      Assessment:  82 y.o. 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 Patient with mild CLL progression relapse with ongoing surveillance, no further treatment recommended at this time. He was found to have moderate right sided pleural effusion after work up of increasing dyspnea on exertion and LE edema which has improved to mild with start of daily Lasix. Symptoms of pain currently managed s/p fall with fracture of manubrium and severe chronic thoracic back pain. He is with new left hip pain currently being evaluated with orthopedics. He continues to benefit from palliative care for pain symptom management and longitudinal GOC in collaboration with providers.      THE PALLIATIVE CARE TEAM WILL CONTINUE TO VISIT TO PROVIDE SUPPORT FOR PATIENT AND FAMILY. FOR IMMEDIATE NEEDS, PT WILL CONTACT [facility], OR 911 AS PALLIATIVE CARE DOES NOT PROVIDE EMERGENCY CARE.      PLAN:  CLL: In relapse since 2022, showing mild symptoms of disease progression in 07/2022 with blood test surveillance and apts q33mos with hem/onc Dr. Laveda Norman.   Prostate CA: Completed XRT treatment in 02/2022. Continues on Lupron injections with Dr. Laveda Norman x 3 years and Casodex 50mg  daily. Followed by Dr. Ezzie Dural [facility].   Pain:  Continue MS Contin 15mg  q12h, Gabapentin 300mg  QHS, and Oxycodone 10mg  q6h PRN for breakthrough and Tylenol 1000mg  BID PRN. Prolia q29months for bone building with Dr. Laveda Norman. [facility]s [facility] with Dr. Lindaann Pascal for thoracic and lumbar cortisone shots.   Functional Decline: Continues with PT at [facility] at twice per week and daily HEP and walking.   Weight Loss: 16% weight loss over last year recently with some fluid shifting improved with daily Lasix at 20mg . Encourage protein supplements BID and small frequent meals.   Depression with chronic pain: Continue with Duloxetine 30mg  QD.   Anxiety: Continue with Valium 2mg  QD PRN for severe anxiety or acute severe muscle spasms associated with intractable back pain. He has not required.   Pressure Injury: Stage 1. Continued use of barrier cream. Offloading every couple of hours with recliner. Encouraged to report if area opens.   DOE/R Pleural Effusion: Continue with Lasix 20mg  daily, Advair Diskus 1 puff 2x daily and Albuterol inhaler PRN.Follows with Pulmonology. Will request most recent note for PFTs and new diagnoses of COPD.    LE Edema: Continue with Lasix 20mg  daily. Rx'd by PCP and monitoring BMP/kidney function, as well as, BPs and knows parameters not to take if BP <100/50.   Left hip pain: New onset over last week worsened with activity. Anticipating orthopedic apt this week with imaging and work up.     Next palliative apt made for 11/20 at 11am and encouraged to call with any questions or concerns in the meantime.      Total time of this visit encounter: 60 minutes, with the time spent on  pre-visit chart review, face to face time with the patient, discussion and counseling with the patient and family, review of the case with the care team, and documentation in the medical record .

## 2022-12-21 MED ORDER — morphine CR (MS Contin) 15 mg 12 hr tablet
15 | ORAL_TABLET | Freq: Two times a day (BID) | ORAL | 0 refills | Status: DC
Start: 2022-12-21 — End: 2022-12-30

## 2022-12-21 NOTE — Progress Notes (Signed)
Medication Refill    Medications refilled per patient request for Morphine CR 15mg  q12h     I am prescribing this medication for the management of symptoms related to severe back pain     Activities of Daily Living: This medication allows for symptom relief, allowing the patient to continue performing their activities of daily living without impairment.    Adverse events: The patient is not suffering from any adverse events.    Aberrant drug-taking behavior: The patient is not displaying any aberrant drug-taking behavior.      Reviewed instructions for proper use and safe storage of medications.     MassPat reviewed.    Patient advised the NO REFILLS after hours, on Holidays or on weekends. NO EARLY REFILLS including for lost, destroyed or missing opiates or other controlled medications.      Narcan prescription and instructions regarding proper use in an emergency provided/previously provided to patient.

## 2022-12-29 MED ORDER — oxyCODONE (Roxicodone) 10 mg immediate release tablet
10 | ORAL_TABLET | Freq: Four times a day (QID) | ORAL | 0 refills | 8.00000 days | Status: AC | PRN
Start: 2022-12-29 — End: 2023-01-09

## 2022-12-29 NOTE — Progress Notes (Signed)
Medication Refill    Medications refilled per patient request for Oxycodone 10mg  q6h PRN    I am prescribing this medication for the management of symptoms related to intractable back pain     Activities of Daily Living: This medication allows for symptom relief, allowing the patient to continue performing their activities of daily living without impairment.    Adverse events: The patient is not suffering from any adverse events.    Aberrant drug-taking behavior: The patient is not displaying any aberrant drug-taking behavior.      Reviewed instructions for proper use and safe storage of medications.     MassPat reviewed.    Patient advised the NO REFILLS after hours, on Holidays or on weekends. NO EARLY REFILLS including for lost, destroyed or missing opiates or other controlled medications.      Narcan prescription and instructions regarding proper use in an emergency provided/previously provided to patient.

## 2022-12-30 MED ORDER — morphine CR (MS Contin) 15 mg 12 hr tablet
15 | ORAL_TABLET | Freq: Two times a day (BID) | ORAL | 0 refills | Status: AC
Start: 2022-12-30 — End: 2023-01-14

## 2022-12-30 NOTE — Progress Notes (Signed)
TC from family daughter Maxine Glenn and spouse Claria Dice that patient was having a BM on 11/28 and heard a pop in his back and went to PCP and Orthopedic found to have another compression fracture of thoracic spine. He is in severe, intractable pain with current regime not effective at MS Contin 15mg  BID and Oxycodone 10mg  q6h PRN for breakthrough. As of mid week we increased MS Contin to 15mg  q8h as of yesterday still requiring breakthrough Oxycodone 10mg  a couple times per day and Valium 2mg  QD. As of today, instructed family to start Morphine CR 30mg  BID with Oxycodone 10mg  q6h for breakthrough and Valium 2mg  QD PRN for severe muscle spasms. I sent in refill for Oxycodone last night and will send in new Morphine CR 30mg  BID today #60. Also instructed to keep with bowel regime of Senna 2 tabs BID and Miralax daily.     Medication Refill    Medications refilled per patient request for Morphine CR 30mg  BID     I am prescribing this medication for the management of symptoms related to severe intractable back pain     Activities of Daily Living: This medication allows for symptom relief, allowing the patient to continue performing their activities of daily living without impairment.    Adverse events: The patient is not suffering from any adverse events.    Aberrant drug-taking behavior: The patient is not displaying any aberrant drug-taking behavior.      Reviewed instructions for proper use and safe storage of medications.     MassPat reviewed.    Patient advised the NO REFILLS after hours, on Holidays or on weekends. NO EARLY REFILLS including for lost, destroyed or missing opiates or other controlled medications.      Narcan prescription and instructions regarding proper use in an emergency provided/previously provided to patient.

## 2023-01-04 ENCOUNTER — Encounter

## 2023-01-06 NOTE — Progress Notes (Signed)
TC with spouse Claria Dice, daughter Maxine Glenn and patient for check in on severe pain from thoracic compression fx found last week. Pain now managed with Morphine CR 30mg  BID and since Tuesday 11/5 has required Oxycodone 10mg  for breakthrough. Patient is with "brain fog", MS changes felt to have changed with increasing pain medication. Discussed change to regime of Morphine CR 30mg  QAM and 15mg  QHS with Oxycodone 10mg  for breakthrough with plan to check in again at the beginning of the week. Patient with pending MRI of thoracic spine next week and discussions amongst oncologist Dr. Laveda Norman and Orthopedic whether biopsy of spine and hip warranted at this time after recent imaging hip found inflammation and lymph nodes with increasing hip pain. I will also call PCP with update regarding patient status and recommend BMP to monitor renal fx with restart of Lasix and increasing pain medication to r/o mental status changes r/t renal fx.

## 2023-01-10 ENCOUNTER — Encounter

## 2023-01-16 NOTE — Telephone Encounter (Addendum)
[phone] Darel Hong or Bill      LVM following up if still at the hospital??    Darel Hong called back and informed us he was transferred from [facility] to [facility]

## 2023-01-18 ENCOUNTER — Encounter: Attending: Adult Health

## 2023-02-04 NOTE — Progress Notes (Signed)
 Is the patient appropriate for Home Care?     Homebound Status: ***  Covid status: ***  Insurance carrier: Payor: MEDICARE / Plan: MEDICARE PART A AND B / Product Type: Medicare /     Date of potential discharge: ***    Is there a next day need?     Does the patient have a PCP?     Is the patient a [facility] patient?    Does patient use Home O2    Have your cordinated with the IP care team on the patients IV?   Have your coordinated with the IP care team on the patients Drains?   Have your coordinated with the IP care team on the patients Wounds?

## 2023-02-09 LAB — CMP (EXT)
ALT/SGPT (EXT): 49 U/L — ABNORMAL HIGH (ref <42)
AST/SGOT (EXT): 16 U/L (ref <41)
Albumin (EXT): 4.4 g/dL (ref 3.5–5.2)
Alkaline Phosphatase (EXT): 388 U/L — ABNORMAL HIGH (ref 40–129)
Anion Gap (EXT): 15 mmol/L (ref 7–17)
BUN (EXT): 27 mg/dL — ABNORMAL HIGH (ref 6–23)
Bilirubin, Total (EXT): 0.3 mg/dL (ref 0.2–1.2)
CO2 (EXT): 27 mmol/L (ref 22–31)
CalciumCalcium (EXT): 9.5 mg/dL (ref 8.8–10.7)
Chloride (EXT): 98 mmol/L (ref 98–107)
Creatinine (EXT): 1.41 mg/dL — ABNORMAL HIGH (ref 0.50–1.20)
GFR Estimated (Calc) (EXT): 50 mL/min/{1.73_m2} — ABNORMAL LOW (ref >59)
Globulin (EXT): 2.2 g/dL — ABNORMAL LOW (ref 2.3–4.2)
Glucose (EXT): 110 mg/dL — ABNORMAL HIGH (ref 70–100)
Potassium (EXT): 4.6 mmol/L (ref 3.4–5.1)
Protein (EXT): 6.6 g/dL (ref 6.4–8.3)
Sodium (EXT): 140 mmol/L (ref 136–145)

## 2023-02-10 NOTE — Telephone Encounter (Signed)
 Voicemail left for return call to schedule [facility]

## 2023-02-11 NOTE — Telephone Encounter (Signed)
I am calling in regard to a referral for home health services we received from Dr.    I would like to confirm your address: [address] [place] [address]   Address confirmed: Yes    I would like to confirm your best contact number: [phone]   Contact number confirmed: Yes    Do you have any other home care services or agencies coming into your home?    Other agency present: No    Do you have another nurse or physical therapist visiting you at home?    Other staff present: No    Do you have an appointment scheduled with your doctor in the next 7 days?  MD follow-up appointment: Yes - Comment: 12/19    Can I confirm that Marcelline Mates, NP is your primary care physician?   PCP confirmed: Yes    If there are pets in the home, please ensure that your pet is in another room during the clinicians' home visits.     If there are weapons in the home, they need to be locked up and secured. If not, the clinician will need to leave the home.     Is anyone in the home experiencing flu-like symptoms or respiratory symptoms?    Flu-like symptoms: No    Did you have any new or changed medications that need to be picked up at the pharmacy?    New medications: No    Were you able to pick them up?    All medications in the home: N/A    Do you have the oxygen in your home?    Have oxygen at home: No    Do you have any supplies you need until our staff comes to your home?    Need supplies immediately: No    Could you please make your insurance card available for visit?    Could you please have all your medications available for your visit, both your prescriptions and any over-the-counter meds you are taking?    The services ordered for you are: Nursing, Physical Therapy, and Occupational Therapy    The team caring for you is [facility]. I am the scheduler for you and my name is or the scheduler for you is Olegario Messier. My/their return number is [phone]. Please feel free to call me back if there is anything I can help with.     Comments: na

## 2023-02-12 ENCOUNTER — Encounter: Admit: 2023-02-12 | Discharge: 2023-02-12 | Payer: MEDICARE

## 2023-02-12 NOTE — Home Health (Signed)
Patient age/gender/living situation/Primary Dx for homecare/Reason for Admission:  [facility] visit for 82 yo male patient lives with his supportive wife in a 2 story condo, multiple other family members involved as well. 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 Patient presented to the ER with AMS and was found to have hypercalcemia and AKI. He reported mid back pain and was found to have a relatively new T7 compression fracture. He had had a recent increase in his MS contin dose from his Palliative care team. He declined further kyphoplasty d/t not feeling much relief in his pain from previous procedures. He was seen by urology who found no obstruction and oncology who recommended continued out patient management of his CLL and that he has had other episodes of hypercalcemia. He transerred to [place] on 11/18. He was readmitted to [facility] on 11/22 after new onset marked hematuria with clots was not responding to continous bladder irrigation-he was receiving lovenox for DVT prevention. He was found to have a blood clot in his bladder and had an emergent cystoscopy for clot evacuation, fulguration of large prostatic vessels on 11/23. Hematuria resolved and he was voiding without difficulty. His WBC count elevated and he was seen by ID and oncology to assess for infection vs. CLL, he devolped multiple episodes of diarrhea and he was worked up for an ileus, stool culture was + for C-diff and he was started on abx and there was no evidence of obstruction and mild ileus. He has improved on abx, improved appetite and no further abdominal pain or nausea. He transferred back to [facility] and on 12/2 he had severe chest pain, NTG was administered x2 with improvement but not resolution and he was transferred back to [facility] and found to have large R mid pleural effusion and negative cardiac work up. Patient returned to [facility] to complete rehab and was  discharged home on 12/11.    Current Problems requiring skilled service to be addressed by above interventions (clinical, mental, adl and functional mobility):  Patient is ambulating with a cane, has a walker if needed. He remains generally weak. Wife is managing meds, appointments and assisting with personal care. He is A&Ox4. He denies any GI distress and reports BM's soft formed daily to QOD. LS are clear, no cough, continues to have sob with exertion. Denies any chest or pleuritic pain. Skin is intact. He has appointments with cardiologist and pcp this week. Patient and wife have good medication knowledge and are using current med list, all meds are present. Patient declined further SN services and wife is in agreement. Reviewed agency numbers and how to contact SN if needed.     Psychosocial and cognitive issues identified which may impact plan of care:  None    Any additional services requested based on risk assessment?  PT/OT    Homebound Status:  See home bound assessment    Patient Identified care representative:  Self/wife    Reviewed DC plan at [facility] with:  Patient Yes  Caregiver/representative Yes       Call to MD(name) Marcelline Mates NP to confirm Medications & Orders Obtained (who you spoke with):   Spoke with patient reception, meds and poc confirmed

## 2023-02-13 ENCOUNTER — Encounter: Admit: 2023-02-13 | Discharge: 2023-02-13 | Payer: MEDICARE

## 2023-02-13 ENCOUNTER — Encounter

## 2023-02-13 MED ORDER — morphine CR (MS Contin) 15 mg 12 hr tablet
15 | ORAL_TABLET | Freq: Two times a day (BID) | ORAL | 0 refills | Status: DC
Start: 2023-02-13 — End: 2023-02-15

## 2023-02-13 NOTE — Home Health (Signed)
Patient age/gender/living situation/Primary Dx for homecare/Reason for Admission: Pt is a pleasant 82 yo male with hx of CLL, prostate CA s/p radiation, compression fxs (unrelated to falls), multiple kyphoplasties who presented to [facility] on 01/09/23 with AMS and was found to have hypercalcemia. Pt's palliative care MD had recently increased morphine dose to 30 mg twice daily for back pain. MRI revealed subacute T7 compression fx. Kyphoplasty was discussed, but ultimately pt/spouse declined kyphoplasty due to minimal relief in the past and plan for ongoing medical mgmt. Once medically stable, pt was transferred to [facility] for STR. Per spouse, pt was at rehab for a few days and developed an ileus, also had blood clots in urine and was transferred back to [facility] where he underwent emergency cystoscopy. Source of bleed was pt's prostate. Pt transferred back to [facility] rehab, then afer several days developed severe chest pain- transferred back to [facility] ED for only 1 night where pt underwent cardiac testing which was neg for PE and MI, pain was determined to possibly be GI related/gastritis. Pt transferred back to [place] where he completed his rehab and he was discharged home w/ family support and VNA orders on 12/11. Pt has 2 follow-ups on 12/19 with PCP at 11am and cardiologist at 1:45 (routine visit). Pt is followed by urologist Dr. Esmond Camper, plans to make telehealth follow-up soon. Pt was also recently active w/ palliative care through [facility]. Of note, pt/spouse report current plan is to stay in [facility] for ~3 months and have flights booked for 03/01/23, PT instructed pt/spouse to keep [facility] up to date on travel plans to coordinate discharge appropriately.    Precautions: spinal precautions (hx of multiple compression fxs)    PLOF: Patient lives w/ spouse and daughter in a multi level condo with 3 STE & rail. Bedroom is located upstairs 7+6 w/ unilateral rail (landing in between flights). Full bathroom w/ walk in shower, built in  seat, and 3 grab bars is located on 2nd floor. Prior to hospitalization, patient was indep with ambulation (walking sticks for longer walks in his neighborhood), indep ADLs, and indep most IADLs except cooking (wife usually does), pt very active and enjoyed gardening. +driving. Pt reports fall ~6 months ago with sternum fx, was hospitalized but no rehab stay and had imaging done recently which showed good healing of fx. No other fall history and compression fxs are reportedly atrumatic in nature. Spouse is able to provide nearly 24/7 assistance and daughter is home in the evenings as well.    DME includes: SPC, RW, transport WC, shower chair, grab bars, HHSH, comfort height toilet, adjustable bed, reacher    Current Problems requiring skilled service to be addressed by above interventions (clinical, mental, adl and functional mobility): Pt presents below baseline in terms of functional mobility tolerance, dynamic balance, and functional strength/endurance. Pt's most significant complaint at this time is BLE weakness, although feels he made good progress at rehab. Pt reports 4/10 chronic pain in low/mid back and takes morphine 2x/day, also alternates between heat/ice therapy. No sensory impairments reported. MMT revealed bilateral, symmetrical global weakness of BLE. Pt is currently using SPC for household ambulation, RW in community.    Assessment: Pt is modified indep (bedrail) with bed mobility. Pt is supervision for transfers using SPC from various sitting surfaces including couch, comfort height toilet, EOB, requires cues for pacing/control. Pt is able to ambulate short household distances with SPC and SBA, demos the following gait deviations: mild shuffling gait, moderate trunk sway, slow/guarded cadence. Stair assessment  CGA on 2 small FOS w/ railing support, pt demos reciprocal pattern and educated on self pacing, eccentric control, and use of nonreciprocal pattern as needed. Also discussed placing chair on  wide landing in case pt needs to take a seated rest break mid stairs. Written HEP issued and reviewed, pt verbalizes understanding w/ all education provided on technique, safety, and frequency/parameters. Patient scored 16/28 on Tinetti, which indicates high fall risk. Educated pt extensively on fall risk/prevention strategies including proper use of DME, use of nonslip footwear, gradual position changes, pacing/energy conservation. Pt verbalized agreement and receptive to PT education.    Psychosocial and cognitive issues identified which may impact plan of care: none    Any additional services requested based on risk assessment? SN active, OT eval pending    Homebound Status: Pt is homebound, please see homebound section    Patient Identified care representative: spouse- Judy    Reviewed DC plan at [facility] with:  Patient Yes  Caregiver/representative Yes     Call to MD(name) Marcelline Mates NP to confirm Medications & Orders Obtained (who you spoke with): RN Dorathy Daft provided verbal orders

## 2023-02-13 NOTE — Progress Notes (Signed)
Medication Refill    Medications refilled per patient request for Morphine CR 15mg  q12h     I am prescribing this medication for the management of symptoms related to severe back pain     Activities of Daily Living: This medication allows for symptom relief, allowing the patient to continue performing their activities of daily living without impairment.    Adverse events: The patient is not suffering from any adverse events.    Aberrant drug-taking behavior: The patient is not displaying any aberrant drug-taking behavior.      Reviewed instructions for proper use and safe storage of medications.     MassPat reviewed.    Patient advised the NO REFILLS after hours, on Holidays or on weekends. NO EARLY REFILLS including for lost, destroyed or missing opiates or other controlled medications.      Narcan prescription and instructions regarding proper use in an emergency provided/previously provided to patient.

## 2023-02-14 ENCOUNTER — Encounter: Admit: 2023-02-14 | Discharge: 2023-02-14 | Payer: MEDICARE

## 2023-02-14 NOTE — Home Health (Signed)
Occupational Therapy Evaluation  Patient age/gender/living situation/Primary Dx for homecare/Reason for Admission: Pt is a pleasant 82 yo male who presented to [facility] on 01/09/23 with AMS and was found to have hypercalcemia. Pt's palliative care MD had recently increased morphine dose to 30 mg twice daily for back pain. MRI revealed subacute T7 compression fx. Kyphoplasty was discussed, but pt/spouse declined kyphoplasty d/t minimal relief in the past and plan for ongoing medical mgmt. Pt transferred to [facility] for STR. pt was at rehab for a few days and developed an ileus, stool for CDIFF POSITIVE, pt put on antibiotics. He also had blood clots in urine and was transferred back to [facility] where he underwent emergency cystoscopy. Source of bleed was pt's prostate. Pt on IV ceftriaxone for presumed UTI. Pt transferred back to [facility] rehab, then after several days developed severe chest pain- transferred back to [facility] ED for only 1 night where pt underwent cardiac testing which was neg for PE and MI, pain was determined to possibly be GI related/gastritis. Pt transferred back to [place] where he completed his rehab and he was discharged home w/ family support and VNA orders on 12/11. Pt was recently active w/ palliative care through [facility]. Pt/spouse report current plan is to stay in [facility] for ~3 months and have flights booked for 03/01/23.  PCP f/u 12/19 11AM  Cardiologist appointment 12/19 1:45 PM  Urology appointment (telehealth) TBD  PMH: hx of CLL, prostate CA s/p radiation, compression fxs (unrelated to falls), multiple kyphoplasty's  Precautions: spinal precautions (hx of multiple compression fxs)     PLOF: Patient lives w/ spouse and daughter in a multi level condo with 3 STE & rail. Bedroom is located upstairs 7+6 w/ unilateral rail (landing in between flights). Full bathroom w/ walk in shower, built in seat, pt also has a shower seat and 3 grab bars + HHS. b/r is located on 2nd floor. Prior to hospitalization,  patient was indep with ambulation (walking sticks for longer walks in his neighborhood), indep ADLs, and indep most IADLs except cooking (wife usually does), pt very active and enjoyed gardening. +driving. Pt reports fall ~6 months ago with sternum fx, was hospitalized but no rehab stay and had imaging done recently which showed good healing of fx. No other fall history and compression fxs are reportedly atraumatic in nature. Spouse is able to provide nearly 24/7 assistance and daughter is home in the evenings as well.     DME includes: SPC, RW, transport WC, shower chair, grab bars, HHSH, comfort height toilet, adjustable bed, reacher     Current Problems requiring skilled service to be addressed by above interventions (clinical, mental, adl and functional mobility): Pt presents with ADL dysfunction, reduced strength, reduced activity tolerance, impaired balance, fall risk, pain, risk of rehospitalization and risk of increased caregiver burden.     Assessment: self-feeding MI, hygiene and groming MI, UB ADL MI, LB ADL, toilet txfr MI, toileting tasks MI- pt has urinal by bed for nighttime use d/t  frequency and to increase safety, shower stall txfr with GB MI + HHS. Recommended increasing shower seat height as it was too low, application of nonskid treads to shower floor and hand rails at tops of stairs that pt reported were slippery. good demo/understanding of spinal precautions     Psychosocial and cognitive issues identified which may impact plan of care: none     Any additional services requested based on risk assessment? No     Homebound Status: Pt is homebound, please  see homebound section     Patient Identified care representative: spouse- Darel Hong     Reviewed DC plan at [facility] with:  Patient Yes  Caregiver/representative Yes      Call to PCP office for VFO approval

## 2023-02-15 ENCOUNTER — Encounter: Admit: 2023-02-15 | Discharge: 2023-02-15 | Payer: MEDICARE

## 2023-02-15 MED ORDER — morphine CR (MS Contin) 15 mg 12 hr tablet
15 | ORAL_TABLET | Freq: Two times a day (BID) | ORAL | 0 refills | Status: AC
Start: 2023-02-15 — End: ?

## 2023-02-15 MED ORDER — DULoxetine (Cymbalta) 30 mg DR capsule
30 | ORAL_CAPSULE | Freq: Every day | ORAL | 3 refills | Status: AC
Start: 2023-02-15 — End: ?

## 2023-02-15 NOTE — Home Health (Signed)
Skilled services required this visit to address clinical and/or functional declines resulting from the following problems: Pt is a pleasant 82 yo male with hx of CLL, prostate CA s/p radiation, compression fxs (unrelated to falls), multiple kyphoplasties who presented to [facility] on 01/09/23 with AMS and was found to have hypercalcemia. Pt's palliative care MD had recently increased morphine dose to 30 mg twice daily for back pain. MRI revealed subacute T7 compression fx. Kyphoplasty was discussed, but ultimately pt/spouse declined kyphoplasty due to minimal relief in the past and plan for ongoing medical mgmt. Once medically stable, pt was transferred to [facility] for STR. Per spouse, pt was at rehab for a few days and developed an ileus, also had blood clots in urine and was transferred back to [facility] where he underwent emergency cystoscopy. Source of bleed was pt's prostate. Pt transferred back to [facility] rehab, then after several days developed severe chest pain- transferred back to [facility] ED for only 1 night where pt underwent cardiac testing which was neg for PE and MI, pain was determined to possibly be GI related/gastritis. Pt transferred back to [place] where he completed his rehab and he was discharged home w/ family support and VNA orders on 12/11.   Of note, pt/spouse report current plan is to stay in [place] for ~3 months and have flights booked for 03/01/23, PT instructed pt/spouse to keep [facility] up to date on travel plans to coordinate discharge appropriately.   Precautions: spinal precautions (hx of multiple compression fxs)    Patient progress toward goals this visit as evidenced by (compare to prior values): Pt feeling well at the time of this visit. He did report however that performing some of the supine exercises this morning left him feeling a little sore. Performed teaching for standing exercises instead with teaching for BLE strengthening- heel raises, hip flex, hip abd, hip ext, squats, hamstring curls  to be done as part of HEP. Recommend 10 reps each. Teaching for technique and pacing. Written/illustrated instructions left/available in the home. Also performed stair/gait outside w single point cane. Teaching for sequencing on stairs and pacing during gait. Pt has mild s/s of fatigue/SOB on exertion but recovered quickly w rest. Pt tolerated session well with no increase in c/o pain.    Patient continues to require the skills of Physical Therapy  to address the following next visit: cont teaching for/progression of HEP, balance    Patient is at risk of decline in the following areas if goals not met: falls; decline in functional mobility necessitating increased level of assistance from family/caregivers

## 2023-02-20 ENCOUNTER — Encounter: Admit: 2023-02-20 | Discharge: 2023-02-20 | Payer: MEDICARE

## 2023-02-20 NOTE — Home Health (Signed)
Skilled services required this visit to address clinical and/or functional declines resulting from the following problems: Pt is a pleasant 82 yo male with hx of CLL, prostate CA s/p radiation, compression fxs (unrelated to falls), multiple kyphoplasties who presented to [facility] on 01/09/23 with AMS and was found to have hypercalcemia. Pt's palliative care MD had recently increased morphine dose to 30 mg twice daily for back pain. MRI revealed subacute T7 compression fx. Kyphoplasty was discussed, but ultimately pt/spouse declined kyphoplasty due to minimal relief in the past and plan for ongoing medical mgmt. Once medically stable, pt was transferred to [facility] for STR. Per spouse, pt was at rehab for a few days and developed an ileus, also had blood clots in urine and was transferred back to [facility] where he underwent emergency cystoscopy. Source of bleed was pt's prostate. Pt transferred back to [facility] rehab, then after several days developed severe chest pain- transferred back to [facility] ED for only 1 night where pt underwent cardiac testing which was neg for PE and MI, pain was determined to possibly be GI related/gastritis. Pt transferred back to [place] where he completed his rehab and he was discharged home w/ family support and VNA orders on 12/11.   Of note, pt/spouse report current plan is to stay in [place] for ~3 months and have flights booked for 03/01/23, PT instructed pt/spouse to keep [facility] up to date on travel plans to coordinate discharge appropriately.   Precautions: spinal precautions (hx of multiple compression fxs)   Patient progress toward goals this visit as evidenced by (compare to prior values): Pt not feeling well today, he thinks it's from the iron supplement he took last night on an empty stomach. Teaching for med management performed.   Focus of this session was on teaching for balance exercises to be done as part of HEP. Provided teaching for standing balance exercises - single leg standing,  tandem standing, tandem walking, side stepping, cross-overs, backward walking, heel walking, toe walking, targeted toe tapping on cone. Exercises to be done as part of HEP. Written/illustrated instructions left/available in the home.  Patient continues to require the skills of Physical Therapy to address the following next visit: cont teaching for/progression of HEP, balance   Patient is at risk of decline in the following areas if goals not met: falls; decline in functional mobility necessitating increased level of assistance from family/caregivers

## 2023-02-20 NOTE — Telephone Encounter (Signed)
[phone] Matthew Werner will be reaching out to them after xmas as planned , they will  be leaving for 3months to [place] and then when they come back she will reach out and schedule follow up then.

## 2023-02-21 LAB — CMP (EXT)
ALT/SGPT (EXT): 6 U/L (ref <42)
AST/SGOT (EXT): 12 U/L (ref <41)
Albumin (EXT): 4.4 g/dL (ref 3.5–5.2)
Alkaline Phosphatase (EXT): 198 U/L — ABNORMAL HIGH (ref 40–129)
Anion Gap (EXT): 12 mmol/L (ref 7–17)
BUN (EXT): 20 mg/dL (ref 6–23)
Bilirubin, Total (EXT): 0.5 mg/dL (ref 0.2–1.2)
CO2 (EXT): 29 mmol/L (ref 22–31)
CalciumCalcium (EXT): 10 mg/dL (ref 8.8–10.7)
Chloride (EXT): 95 mmol/L — ABNORMAL LOW (ref 98–107)
Creatinine (EXT): 1.2 mg/dL (ref 0.50–1.20)
GFR Estimated (Calc) (EXT): 60 mL/min/{1.73_m2} (ref >59)
Globulin (EXT): 1.9 g/dL — ABNORMAL LOW (ref 2.3–4.2)
Glucose (EXT): 118 mg/dL — ABNORMAL HIGH (ref 70–100)
Potassium (EXT): 4 mmol/L (ref 3.4–5.1)
Protein (EXT): 6.3 g/dL — ABNORMAL LOW (ref 6.4–8.3)
Sodium (EXT): 136 mmol/L (ref 136–145)

## 2023-02-24 ENCOUNTER — Encounter: Admit: 2023-02-24 | Discharge: 2023-02-24 | Payer: MEDICARE

## 2023-02-24 MED ORDER — morphine 100 mg/5 mL (20 mg/mL) concentrated solution
100 | ORAL | 0 refills | Status: DC | PRN
Start: 2023-02-24 — End: 2023-02-25

## 2023-02-24 NOTE — Progress Notes (Signed)
Medication Refill    Medications refilled per patient request for Morphine 100mg /9ml (20mg /ml) concentrated, take 0.51mL (5mg ) PO q4h if needed for severe breakthrough pain     I am prescribing this medication for the management of symptoms related to intractable back pain     Activities of Daily Living: This medication allows for symptom relief, allowing the patient to continue performing their activities of daily living without impairment.    Adverse events: The patient is not suffering from any adverse events.    Aberrant drug-taking behavior: The patient is not displaying any aberrant drug-taking behavior.      Reviewed instructions for proper use and safe storage of medications.     MassPat reviewed.    Patient advised the NO REFILLS after hours, on Holidays or on weekends. NO EARLY REFILLS including for lost, destroyed or missing opiates or other controlled medications.      Narcan prescription and instructions regarding proper use in an emergency provided/previously provided to patient.

## 2023-02-24 NOTE — Home Health (Signed)
Skilled services required this visit to address clinical and/or functional declines resulting from the following problems: Pt is a pleasant 82 yo male with hx of CLL, prostate CA s/p radiation, compression fxs (unrelated to falls), multiple kyphoplasties who presented to [facility] on 01/09/23 with AMS and was found to have hypercalcemia. Pt's palliative care MD had recently increased morphine dose to 30 mg twice daily for back pain. MRI revealed subacute T7 compression fx. Kyphoplasty was discussed, but ultimately pt/spouse declined kyphoplasty due to minimal relief in the past and plan for ongoing medical mgmt. Once medically stable, pt was transferred to [facility] for STR. Per spouse, pt was at rehab for a few days and developed an ileus, also had blood clots in urine and was transferred back to [facility] where he underwent emergency cystoscopy. Source of bleed was pt's prostate. Pt transferred back to [facility] rehab, then after several days developed severe chest pain- transferred back to [facility] ED for only 1 night where pt underwent cardiac testing which was neg for PE and MI, pain was determined to possibly be GI related/gastritis. Pt transferred back to [place] where he completed his rehab and he was discharged home w/ family support and VNA orders on 12/11.   Precautions: spinal precautions (hx of multiple compression fxs)   Patient progress toward goals this visit as evidenced by (compare to prior values): Pt feeling well today. Focus of this session was on balance. Standing balance activities - BLE strengthening exercises and reaching outside BOS on balance pad. Pt demonstrated progression of balance exercises taught on last visit, able to perform tandem walking with hand resting close to counter with only occ use to steady self without needing to stop to regain posture.   Patient continues to require the skills of Physical Therapy to address the following next visit: cont teaching for/progression of HEP, balance; PT to  assess patient status for d/c from current services as pt is leaving the service area for a few months.  Patient is at risk of decline in the following areas if goals not met: falls; decline in functional mobility necessitating increased level of assistance from family/caregivers

## 2023-02-25 MED ORDER — morphine 100 mg/5 mL (20 mg/mL) concentrated solution
100 | ORAL | 0 refills | Status: AC | PRN
Start: 2023-02-25 — End: 2023-03-27

## 2023-02-27 ENCOUNTER — Encounter: Admit: 2023-02-27 | Discharge: 2023-02-27 | Payer: MEDICARE

## 2023-02-27 NOTE — Home Health (Signed)
Reason for admission: Pt is a pleasant 82 yo male with hx of CLL, prostate CA s/p radiation, compression fxs (unrelated to falls), multiple kyphoplasties who presented to [facility] on 01/09/23 with AMS and was found to have hypercalcemia. Pt's palliative care MD had recently increased morphine dose to 30 mg twice daily for back pain. MRI revealed subacute T7 compression fx. Kyphoplasty was discussed, but ultimately pt/spouse declined kyphoplasty due to minimal relief in the past and plan for ongoing medical mgmt. Once medically stable, pt was transferred to [facility] for STR. Per spouse, pt was at rehab for a few days and developed an ileus, also had blood clots in urine and was transferred back to [facility] where he underwent emergency cystoscopy. Source of bleed was pt's prostate. Pt transferred back to [facility] rehab, then after several days developed severe chest pain- transferred back to [facility] ED for only 1 night where pt underwent cardiac testing which was neg for PE and MI, pain was determined to possibly be GI related/gastritis. Pt transferred back to [place] where he completed his rehab and he was discharged home w/ family support and VNA orders on 12/11. Precautions: spinal precautions (hx of multiple compression fxs)    General Synopsis of Care provided: Patient has been motivated and cooperative during PT visits and has participated in activities including gait training, transfer training, therapeutic exercise, balance training, stair training, and pt/family education re: fall risk/prevention, pacing strategies, HEP compliance, spinal precautions, and general home safety. On initial evaluation, patient required supervision for transfers, SBA for household ambulation w/ SPC, and CGA for stair negotiation within home. Following completion of training and education this visit, patient has now met all LTGs and is appropriate for discharge. Patient is now indep for transfers, indep vs mod I ambulation in home, mod I w/  rail/SPC for stairs in home. Pt states he plans to use RW for further outdoor distances, RW has already been transported down to [facility]. Unable to assess outdoor gait today due to weather, but pt states he uses cane for short distances. Tinetti score is 24/28, indicating patient is a low fall risk. Patient demonstrates good knowledge of HEP and is able to teach back benefits of continuing daily compliance after discharge. Patient reports no further questions or concerns at this time. Pt/spouse agree with plan to discharge [facility] services. Pt and spouse leaving for [place] on 1/1; plan to return on 4/1, pt was instructed to follow-up with PCP (has appt 1/3) and specialists as indicated and to seek referral for ongoing PT either in home setting or outpatient if appropriate.    Status of goals: all goals met    Plan of care post DC, including referrals made: PCP office notified of [facility] agency DC on this date; pt has appointment w/ [place] PCP on 1/3 and will discuss OP PT referral at that time    Skill provided this visit: cues for standing BLE HEP to improve postural awareness and eccentric control, also educated pt on progression of exercises and importance of pain management for overall mobility tolerance/safety. Gait/stair training in home w/ min cues to improve self pacing and initiate rest breaks as needed. Pt education on pain control regimen (supplementing oral meds w/ ice/heat), self pacing/energy conservation, fall prevention strategies; pt very receptive to PT education.

## 2023-03-03 ENCOUNTER — Encounter

## 2023-03-06 ENCOUNTER — Encounter

## 2023-03-07 NOTE — Unmapped (Signed)
 Verbal SOC was obtained on 02/12/23.

## 2023-03-07 NOTE — Unmapped (Signed)
I certify that this patient is confined to his/her home and needs intermittent skilled nursing care, physical therapy/ and/or speech therapy or continues to need occupational therapy.  This patient is under my care and I have authorized the services on this plan of care and will periodically review the plan. I further certify that this patient had a Face-to-Face Encounter performed by Orland Mustard, FNP on 12.11.24 that was related to the primary reason the patient requires Home Health services.

## 2023-03-10 ENCOUNTER — Encounter

## 2023-03-14 IMAGING — DX THORACIC SPINE 2 VIEWS
2 series · 2 of 2 positions shown · non-contrast
Comparison: MRI lumbar spine March 20, 2021.

________________________________________________________________________________________________ 
THORACIC SPINE 2 VIEWS, 03/14/2023 [DATE]: 
CLINICAL INDICATION: Thoracic radiculopathy. Follow-up fracture T7-T8. 
Osteoporosis..

[AP]
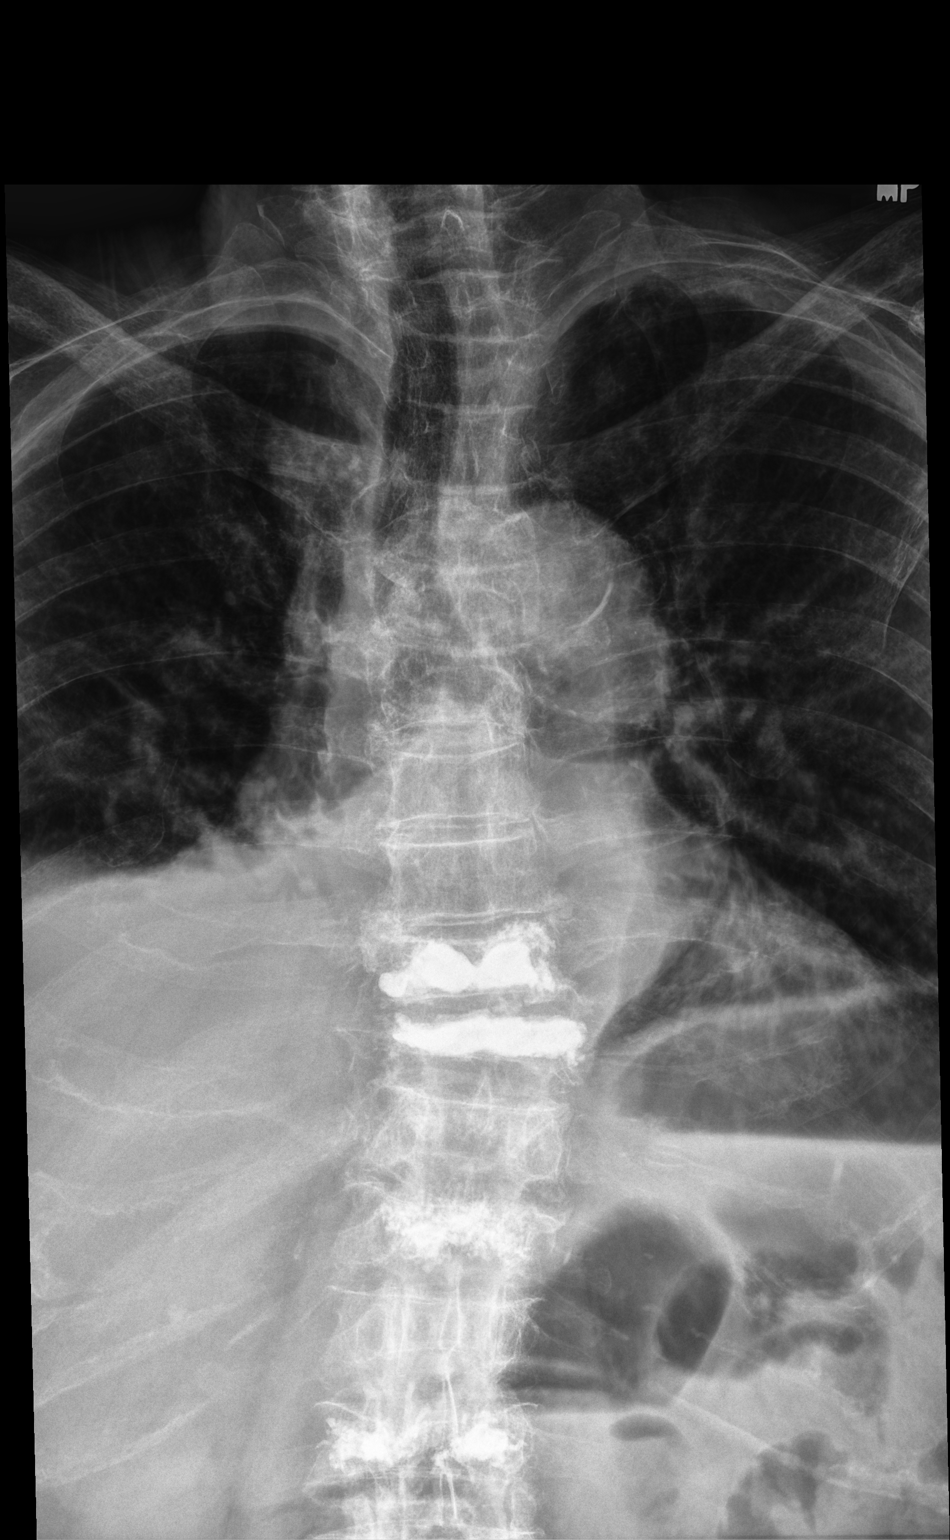

[lateral]
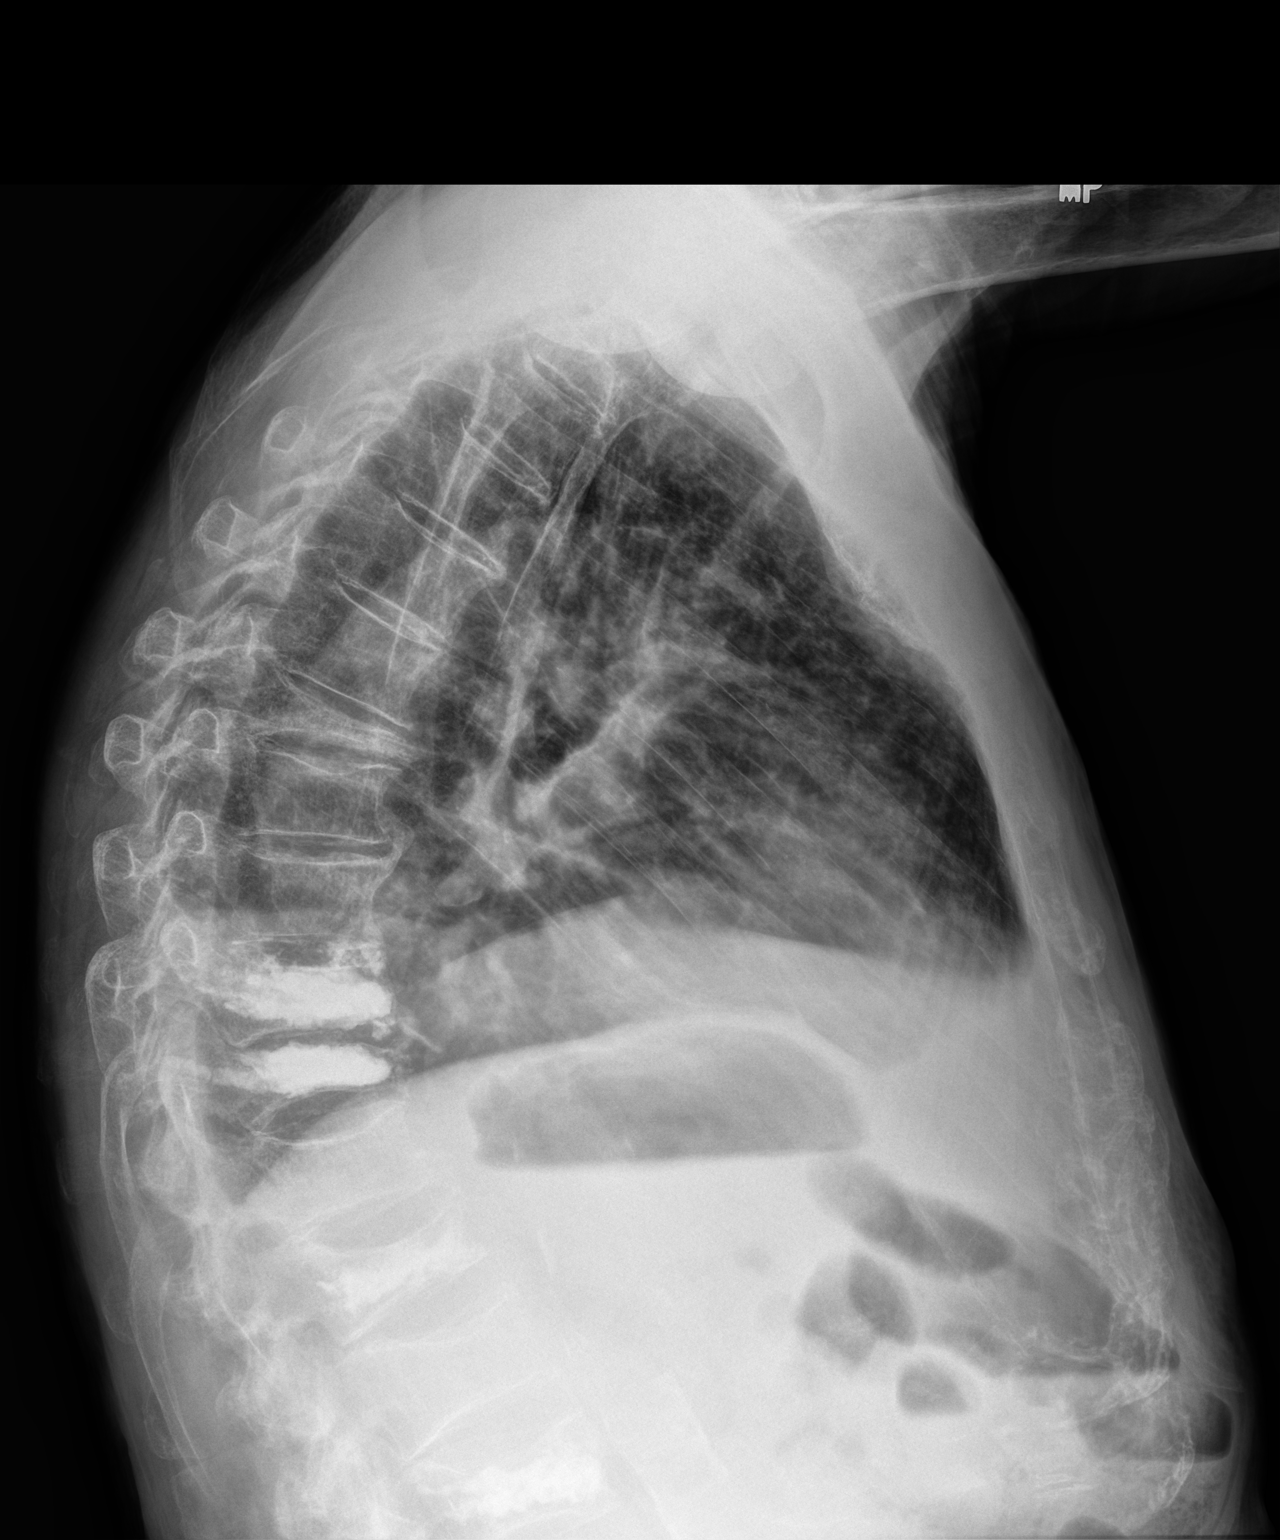

[2 of 2 positions shown; findings below may reference images not displayed]

FINDINGS: 12 rib-bearing thoracic vertebral segments. Osteopenia limits 
evaluation of fine osseous detail, although there is evidence of T10 and T11 
compression fracture deformities previously treated by vertebroplasty. T10 
compression fracture is moderate in degree. There is some vertebroplasty cement 
has extruded into the T9-T10 disc space as well as into the anterior aspect of 
the T10-T11 disc space. 
Moderate to moderately severe T11 compression deformity previously treated by 
vertebroplasty. 
Severe T7 compression fracture deformity with vertebral plana configuration. 
Mild superior endplate compression deformity of T12 is age indeterminate, 
although appears to have developed since the MRI lumbar spine March 20, 2021. 
No osseous retropulsion. 
Moderate to moderately severe superior endplate compression deformity of L1. 
There is some vertebroplasty cement within the L1 vertebral body. Amount of 
height loss is stable to MRI generated 19/03/2021. 
Mild to moderate superior endplate L2 compression deformity is new since prior 
MRI study. 
Incompletely visualized is superior endplate compression deformity of L3, 
previously treated by vertebroplasty. This compression has developed since prior 
MRI study. 
There is mild levoconvex lower thoracic scoliosis. 
Obscuration of the right hemidiaphragm, indeterminate, but could be related to 
effusion and/or atelectasis. Infiltrate at the right lung base would be 
difficult to exclude. Remainder the lungs are clear. Heart size difficult to 
assess. Atherosclerotic changes.
IMPRESSION: Numerous thoracic and lumbar compression fracture deformities, detailed above, 
some of which have been previously treated by vertebroplasty. Correlate with 
upcoming MRI lumbar spine.  
Osteopenia. Advise further assessment with DEXA with trabecular bone score, if 
not already performed. 
Obscuration of the right hemidiaphragm. See above discussion. Chest CT would 
better assess.

## 2023-03-28 IMAGING — MR MRI THORACIC SPINE WITHOUT CONTRAST
4 of 10 series · 15 of 48 positions shown · non-contrast
Comparison: 03/14/2023 radiographs

________________________________________________________________________________________________ 
MRI THORACIC SPINE WITHOUT CONTRAST, 03/28/2023 [DATE]: 
CLINICAL INDICATION: Radiculopathy, Thoracic Region. History of prostate 
carcinoma and CLL.
TECHNIQUE: Multiplanar, multiecho position MR images of the thoracic spine were 
performed without contrast. Patient was scanned on a 1.5T magnet.

[Series 202: T2 · sagittal · 4.0mm · 0.50mm/px · 1 of 11 slices shown (1 of 2)]
[im 1/11]
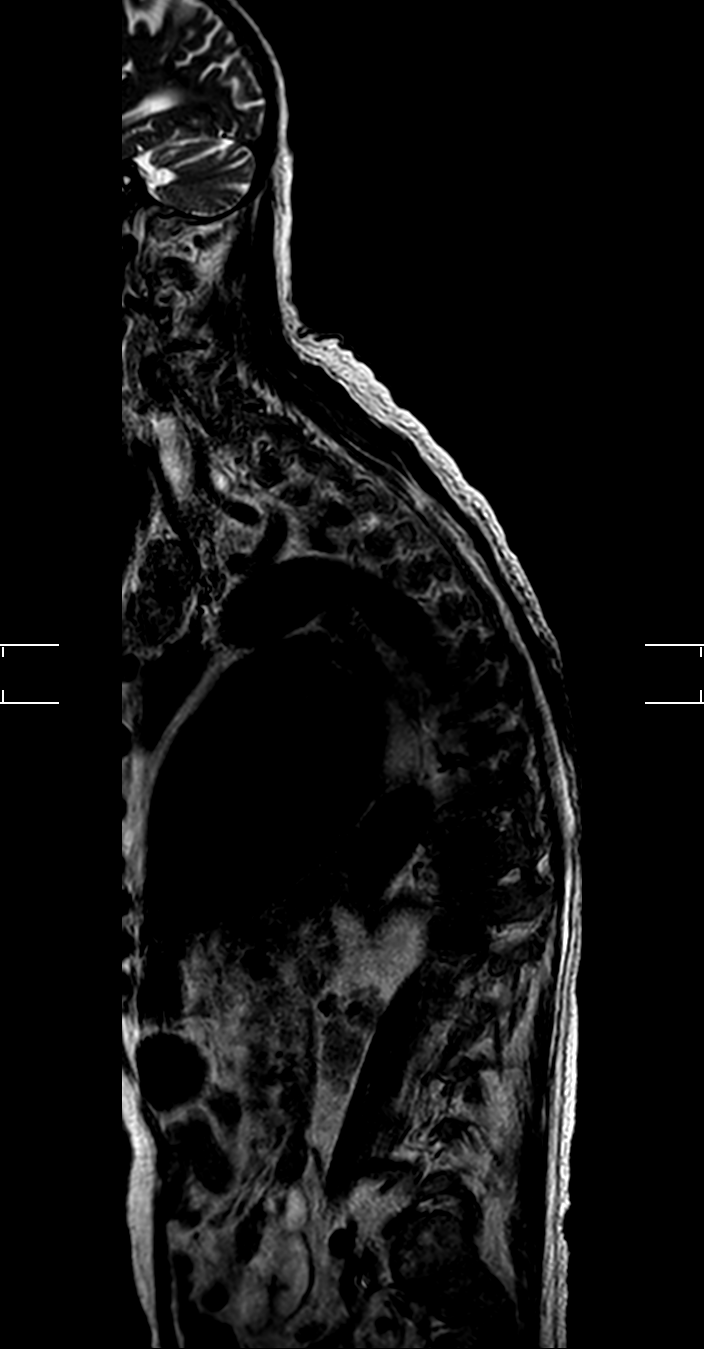

[Series 401: T1 · sagittal · 3.0mm · 0.55mm/px · 3 of 21 slices shown]
[im 1/21]
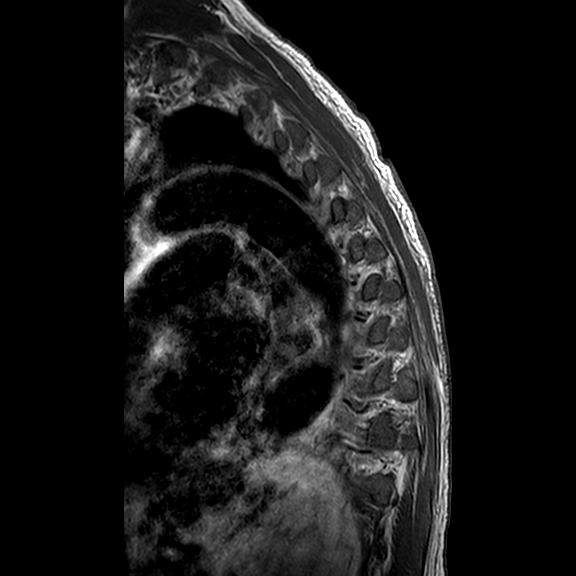
[im 11/21]
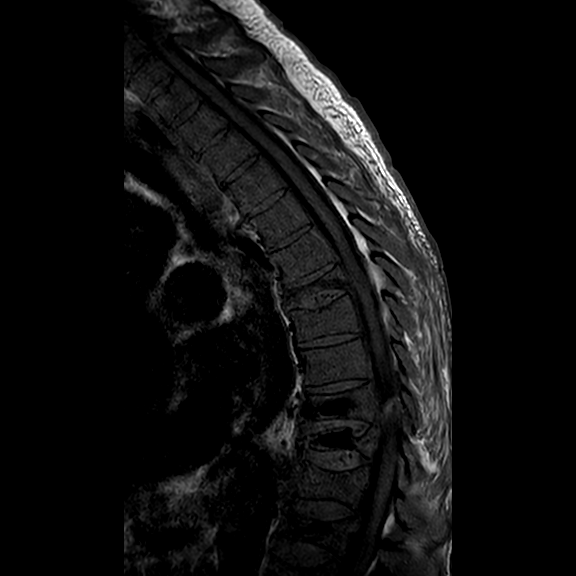
[im 21/21]
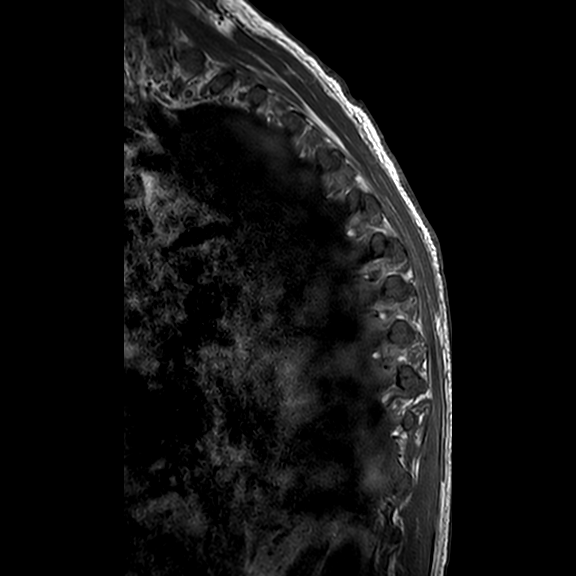

[Series 701: STIR · sagittal · 3.0mm · 0.49mm/px · 3 of 21 slices shown]
[im 1/21]
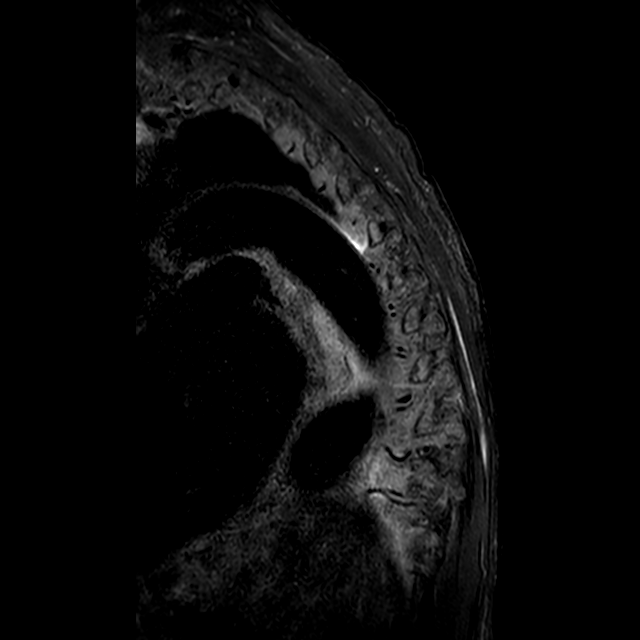
[im 11/21]
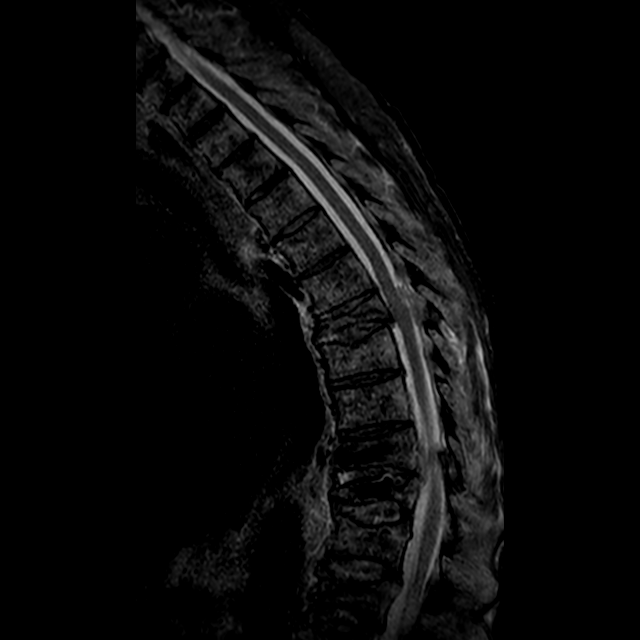
[im 21/21]
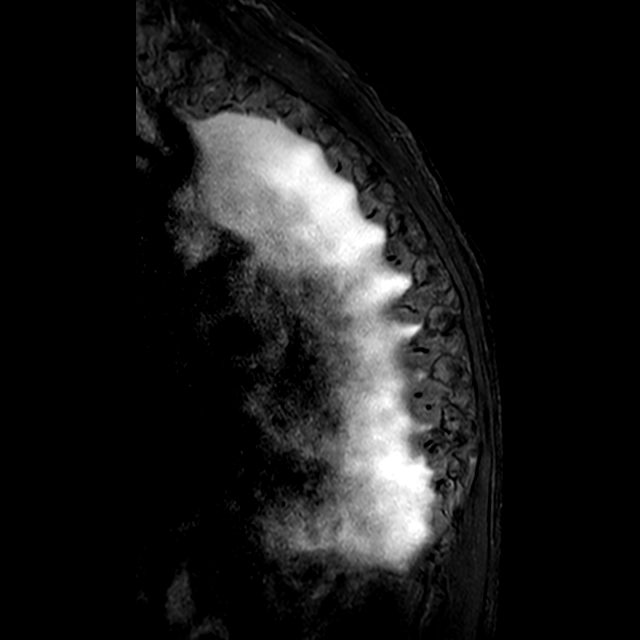

[Series 801: T2 · axial · 4.0mm · 0.42mm/px · z∈[+21,+251]mm · 8 of 75 slices shown (2 of 2)]
[im 1/75]
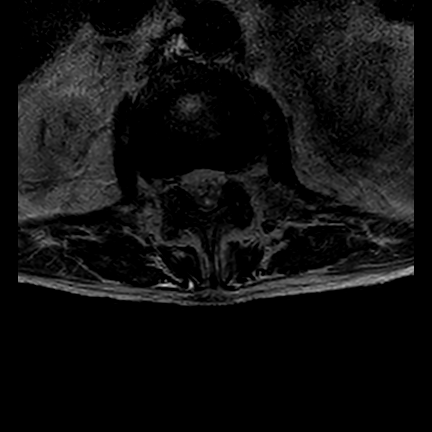
[im 9/75]
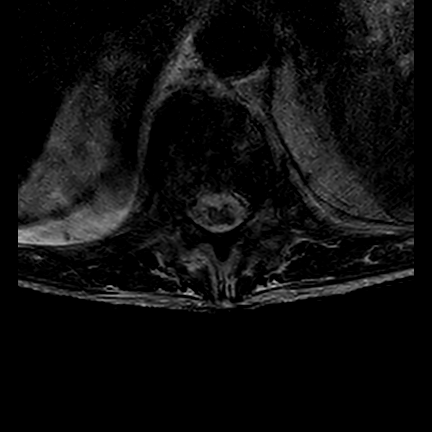
[im 25/75]
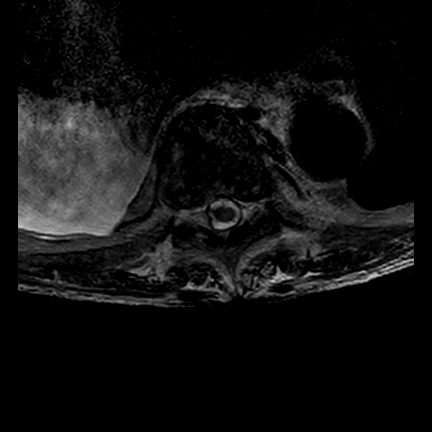
[im 33/75]
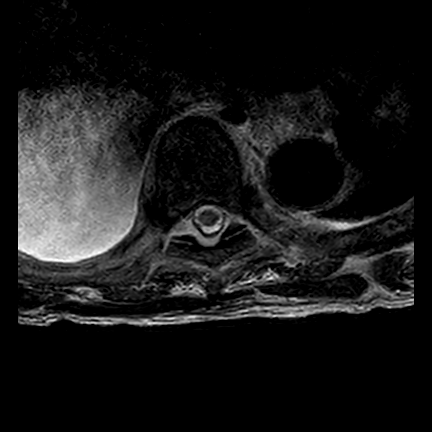
[im 42/75]
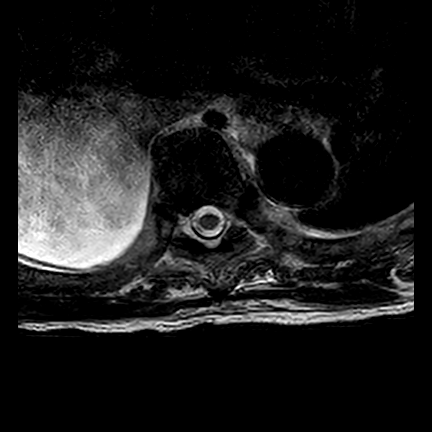
[im 50/75]
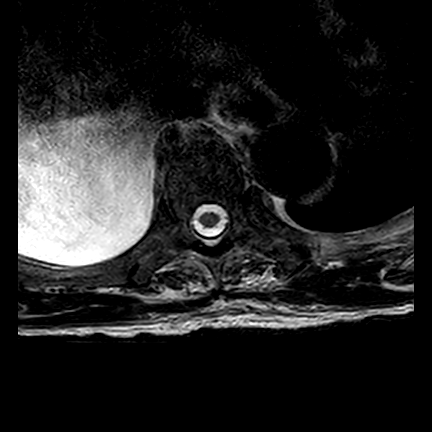
[im 66/75]
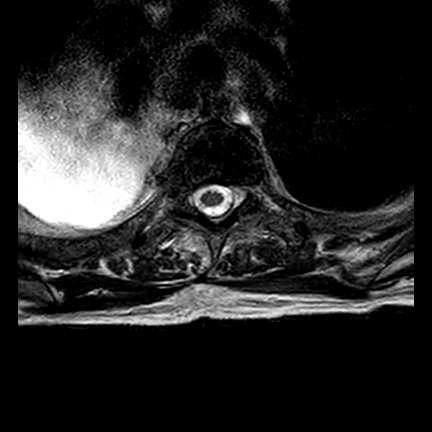
[im 75/75]
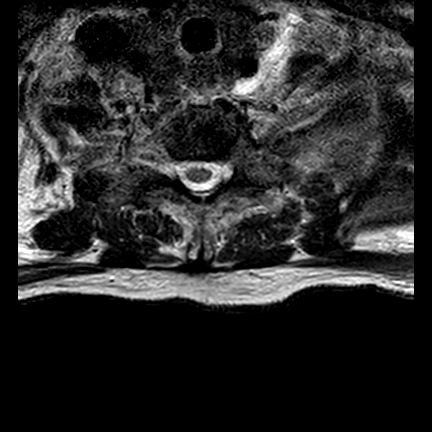

[15 of 48 positions shown; findings below may reference images not displayed]

FINDINGS: VERTEBRA: Diffuse marrow replacement in this patient with known CLL. Chronic 
severe T7 vertebral body compression fracture (with 70% loss of central height, 
0.3 cm posterior positioning of the T7 posterior cortex relative to T8 and
cm inferior endplate fluid-filled cleft) results in mild central canal stenosis, 
partial effacement of the CSF and moderate neural foraminal stenosis. Chronic 
mild T8/T12 vertebral body compression fractures. Chronic severe T10/T11/L1 
vertebral body compression fractures with cement augmentation; 0.4 cm 
retropulsion of the remodeled T11 posterior superior cortex. Multilevel marginal 
osteophytes, kyphosis and narrowing of several additional neural foramina.  
DISCS: Multilevel degenerative disc disease. No disc protrusions or evidence of 
discitis.  
SPINAL CORD: Mild flattening of the ventral cord at the level of T11. Spinal 
cord is otherwise normal in caliber and signal intensity. Conus medullaris is at 
the level of L1.  
OTHER: Mild posterior paraspinal soft tissue swelling, most marked at T9. Large 
right pleural effusion and right lung partial collapse. Localizer views 
demonstrate moderate L2-3 vertebral body compression fractures, multifocal 
degenerative change and mild multifocal lymphadenopathy (right supraclavicular, 
bilateral axillary, retroperitoneal).
IMPRESSION: 1.  Diffuse marrow replacement, multifocal lymphadenopathy and large right 
pleural effusion in this patient with known CLL.  
2.  Chronic severe T7 vertebral body compression fracture results in mild 
central canal and moderate neural foraminal stenosis.  
3.  Chronic fractures are mild at T8/T12, moderate at L2-3 and severe at 
T10/T11/L1 (with cement augmentations). 
4.  Thoracic kyphosis and degenerative change.

## 2023-05-31 NOTE — Telephone Encounter (Addendum)
 [phone] Darel Hong or Bill            Called and LVM to follow up if back from [facility] and check up how he is doing and to set up a follow up appt if they are back from [facility] called me back and scheduled appt for a follow up so when they are back from [facility] on 04/29 appt set for 05/02 at 930am

## 2023-06-19 NOTE — Progress Notes (Signed)
 Is the patient appropriate for Home Care?     Homebound Status: ***  Covid status: ***  Insurance carrier: Payor: MEDICARE / Plan: MEDICARE PART A AND B / Product Type: Medicare /     Date of potential discharge: ***    Is there a next day need?     Does the patient have a PCP?     Is the patient a [facility] patient?    Does patient use Home O2    Have your cordinated with the IP care team on the patients IV?   Have your coordinated with the IP care team on the patients Drains?   Have your coordinated with the IP care team on the patients Wounds?

## 2023-06-29 NOTE — Telephone Encounter (Signed)
 [phone] Matthew Werner                  Confirmed appt for 05/02 at 930am

## 2023-06-30 ENCOUNTER — Encounter: Admit: 2023-06-30 | Payer: MEDICARE | Attending: Adult Health

## 2023-06-30 DIAGNOSIS — J90 Pleural effusion, not elsewhere classified: Secondary | ICD-10-CM

## 2023-06-30 MED ORDER — DULoxetine (Cymbalta) 30 mg DR capsule
30 | ORAL_CAPSULE | Freq: Every day | ORAL | 1 refills | 30.00000 days | Status: AC
Start: 2023-06-30 — End: ?

## 2023-06-30 MED ORDER — morphine CR (MS Contin) 30 mg 12 hr tablet
30 | ORAL_TABLET | Freq: Two times a day (BID) | ORAL | 0 refills | 28.00000 days | Status: DC
Start: 2023-06-30 — End: 2023-07-27

## 2023-06-30 NOTE — Telephone Encounter (Addendum)
 Call to client-spoke to Matthew Werner-she states they have just returned home from [place] , client has a pleurx cath and had VNA in [place]  every 3 days for Pleurx drainage-however the MD there just agreed to decrease to every 4 days-they were getting approximately each time. She has 2 bottles in the home, will need supplies ordered when SN visits.    Call to Dr Corwin Dings call back as the orders we received were not specific for frequency of pleurx drain and how much we can drain-return number left.    Second call to Dr Mickiel Albany at 3p-spoke to [facility] Nurse Navigator-verbal order obtained for draining Pleurx cath every 4 days-max 1046ml-call them if frequency needs to be adjusted

## 2023-06-30 NOTE — Telephone Encounter (Signed)
 I am calling in regard to a referral for home health services we received from PCP.    I would like to confirm your address: [address] [place] [address]   Address confirmed: Yes    I would like to confirm your best contact number: [phone]   Contact number confirmed: Yes    Do you have any other home care services or agencies coming into your home?    Other agency present: No    Do you have another nurse or physical therapist visiting you at home?    Other staff present: No    Do you have an appointment scheduled with your doctor in the next 7 days?  MD follow-up appointment: Yes - Comment: 5/5    Can I confirm that Cephas Collier, NP is your primary care physician?   PCP confirmed: Yes    If there are pets in the home, please ensure that your pet is in another room during the clinicians' home visits.     If there are weapons in the home, they need to be locked up and secured. If not, the clinician will need to leave the home.     Is anyone in the home experiencing flu-like symptoms or respiratory symptoms?    Flu-like symptoms: No    Did you have any new or changed medications that need to be picked up at the pharmacy?    New medications: No    Were you able to pick them up?    All medications in the home: No    Do you have the oxygen in your home?    Have oxygen at home: No    Do you have any supplies you need until our staff comes to your home?    Need supplies immediately: No    Could you please make your insurance card available for visit?    Could you please have all your medications available for your visit, both your prescriptions and any over-the-counter meds you are taking?    The services ordered for you are: Nursing    The team caring for you is law. I am the scheduler for you and my name is or the scheduler for you is CATY REIS. My/their return number is [phone]. Please feel free to call me back if there is anything I can help with.     Comments:

## 2023-06-30 NOTE — Unmapped (Signed)
 Palliative Care  [address]  [place], [place] [address]  Dept: [phone]     Chief Complaint: Exploration of Treatment Options, CLL, Prostate CA, Pain, Functional Decline, Weight loss, Depression, Anxiety, DOE, LE edema, Right Pleural Effusion         Reason for visit: Symptom Management     Place of service: Home, spouse Marily Shows present     Type of visit (new/established): Established     [id number] E Matthew Werner  83 y.o. 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 Kent Pear with LBP improvement but with persistent right thigh pain. Then with increasing difficulty lying flat for XRT for prostate CA and increasing pain, found more compression and underwent T10-T11 kyphoplasty/biopsy with Dr Wendy Hamel on 12/20. 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 In 01/2022 found with severe debilitating thoracolumbar pain below area of T10/T11 found no longer to be surgical candidate with recommendation to follow with palliative care for pain management while pursuing CA treatment and continued CLL surveillance. Today, patient being seen for follow up palliative care visit.     At time of this visit, patient has returned from [facility], he was there from January through April. While there, he was hospitalized presenting with worsening SOB and found to have large right pleural effusion with pleural fluid showing CLL. He required pleurx placement and since February with pleurx drained q3 days. Last drained on Monday 4/28 with 300cc. He is anticipating [facility]NA admission tomorrow and will be  receiving draining orders from oncologist Dr. Mickiel Albany. Follow up apt with Dr. Mickiel Albany scheduled for 5/15. While in [place], he was started on Calquence 100mg  BID for CLL and prophylactic Acyclovir 400mg  BID. He also remains on Lasix 20mg  that was increased from QD to BID. Today, he denies SOB at rest and with exertion and has not required inhaler. He also denies CP, palpitations and dizziness. Endorses trace bilateral pedal edema R>L improved with daily compression stockings.     Also, while in [facility], he saw pain management clinic for ongoing pain management. Unfortunately, he received a radio ablation procedure where he layed flat on a table and the strap buckling him in caused 5 rib fractures about a month, 3 on right side and 2 on left side. So in addition to severe back pain, he has had severe rib pain. Rib pain is improving with less tenderness to touch.Today, describing pain 6/10 mid back achy at rest but with most movement and standing can be severe and sharp despite current regime of Morphine  CR 15mg  BID and requiring Oxycodone  10mg  BID for breakthrough pain. He is also taking gabapentin  300mg  QHS and Duloxetine  30mg  daily. Reports bowel regime in place with senna 2 tabs QHS but feels BM's have not been as frequent and wants recommendations to increase.       Allergies[1]      Current Medications[2]    Review of Systems   CONSTITUTION: no fever/chills, no fatigue, +daytime drowsiness-one nap per day, decent appetite currently 2-3 meals/day, portion sizes 25-50% less compared to 6 months ago, + weight loss 190lbs a year ago, 04/2022 160lbs, 05/2022 165lbs,  150lbs 6/24, 158lbs 07/2022, 167lbs 10/2022, 160lbs 11/2022, 158lbs today   EYES: no changes in vision    ENT: no dysphagia, no odynophagia  CV: No CP, no palpitations, no dizziness, no lightheadedness, + bilateral pedal LE edema -wears compression stockings  RESP: no cough, no sputum, reports no SOB at rest or with communication, no orthopnea, + DOE on moderate exertion,  +new pleurx catheter placement    GI: +reflux managed with omeprazole, no n/v, no constipation, no diarrhea, last BM yesterday, no abdominal pain, no bowel incontinence  GU: no urinary incontinence, no dysuria, + urinary frequency improved and managed with Gemtesa   NEURO:  no H/A's, no neuropathy, no asymmetrical weakness, + LE weakness   MUSCULOSKELETAL: + joint stiffness and pain in back and bilateral ribs   PSYCH/COGNITION: reports managed depression and anxiety with Duloxetine , no sleep disturbance, no STM loss, no confusion   MOBILITY/Functional Status/ADLs: transfers and ambulates on own, cane and walking sticks for long distances, performs own ADLs  DIET: occasional Boost with protein 2x/day, high protein diet    SKIN: no rash, no breakdown     Elder Abuse Screen: Negative     PH-Q2:   Not performed today       Advance Care Planning   HCP wife Benjamin Brands primary, daughter Aurea Leek alternate; MOLST completed to reflect DNR/DNI wishes, copy in the home      Care team:  Cephas Collier, NP PCP, [facility], [place]    Dr. Milly Almas, Oncologist, managing Prolia q65mos, Lupron injections, CLL (blood tests)  Dr. Madelon Scheuermann, [facility], [address], [place]  Dr. Rickard Charles, Radiation Oncologist, [facility]   Dr. Wendy Hamel, Neurosurgeon, [facility]  Dr. Thyra Flower, Cardiologist   Dr. Griffin Lecher, Urologist    Dr. Maryland Snow, GI   Dr. Nestor Banter PA-C, Pulmonologist, [facility]     PE:  BP 101/58 (BP Location: Right arm, Patient Position: Sitting)   Pulse 85   Resp 18   Wt 71.7 kg   SpO2 98%   BMI 27.12 kg/m    PPS Score: 50%   CONSTITUTIONAL: sitting hunched in chair in living room, distressed at times with pain levels, pleasant and engaged   HEENT: sclera anicteric, conjunctiva non-injected, no oral lesions  CV: RRR, no JVD, trace bilateral pedal edema    PULM: CTA, no accessory muscle use   GI:  +BS's, soft, NT/ND, no organomegaly    NEURO: A&Ox4, CN  II-XII grossly intact, grip strength intact, UE strength 5/5, LE strength 5/5, speech fluent and clear    MUSCULOSKELETAL:  tenderness to bilateral ribs; no deformities   PSYCH: mood/affect appropriate; judgement and insight intact; memory and recall of recent medical events intact    SKIN: no rash, no breakdown      LABS:  None recent available     IMAGING:  None recent available      Assessment:   83 y.o. 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 Patient returning home from winter in [place] with CLL progression relapse and large right pleural effusion showing CLL cells requiring right pleurx catheter placement and started on Calquence 100mg  BID. Symptoms of pain in back and bilateral ribs are currently not managed at moderate and severe  pain levels with movement with recommendations below. He continues to benefit from palliative care for pain symptom management and longitudinal GOC in collaboration with providers.        THE PALLIATIVE CARE TEAM WILL CONTINUE TO VISIT TO PROVIDE SUPPORT FOR PATIENT AND FAMILY. FOR IMMEDIATE NEEDS, PT WILL CONTACT [facility], OR 911 AS PALLIATIVE CARE DOES NOT PROVIDE EMERGENCY CARE.     PLAN:  CLL: In relapse since 2022 with progression and large pleural effusion pleural fluid showing CLL cells with pleurx catheter placed and started on Calquence 100mg  BID. Follows with hem/onc Dr. Mickiel Albany with next apt on 5/15.   Prostate CA: Completed XRT treatment in 02/2022. Continues on Lupron injections with Dr. Mickiel Albany x 3 years and Casodex 50mg  daily. Followed by Dr. Cheree Cords [facility].   Pain: Start increase to MS Contin  30mg  q12h, Gabapentin  300mg  QHS, and Oxycodone  10mg  q6h PRN for breakthrough and Tylenol 1000mg  BID PRN.  Prolia q6months for bone building with Dr. Mickiel Albany. Follows with [facility]opedic at [facility] with Dr. Madelon Scheuermann for thoracic and lumbar cortisone shots.   Functional Decline: Recommend return to PT therapies either in home or at [facility].    Weight Loss: 16% weight loss over last year recently with some fluid overload shifting improved with Lasix 20mg  BID. Encourage protein supplements BID and small frequent meals.   Depression with chronic pain: Continue with Duloxetine  30mg  QD.   Anxiety: Continue with Valium  2mg  QD PRN for severe anxiety or acute severe muscle spasms associated with intractable back pain. He has not required.   DOE/R Pleural Effusion: Continue with R pleurx catheter placement with VNA to start services tomorrow to assist and monitor pleurx draining, Lasix 20mg  BID, Advair Diskus 1 puff 2x daily and Albuterol inhaler PRN (has not required inhalers since drain placement. Follows with Pulmonology.   LE Edema: Continue with Lasix 20mg  BID. Rx'd by PCP and monitoring BMP/kidney function, as well as, BPs and knows parameters not to take if BP <100/50.        Next palliative visit on 5/29 at 10am      Total time of this visit encounter: 60 minutes, with the time spent on pre-visit chart review, face to face time with the patient, discussion and counseling with the patient and family, review of the case with the care team, and documentation in the medical record .    Medication Refill    Medications refilled per patient request for Morphine  30mg  BID    I am prescribing this medication for the management of symptoms related to severe back and bilateral rib pain     Activities of Daily Living: This medication allows for symptom relief, allowing the patient to continue performing their activities of daily living without impairment.    Adverse events: The patient is not suffering from any adverse events.    Aberrant drug-taking behavior: The patient is not displaying any aberrant drug-taking  behavior.      Reviewed instructions for proper use and safe storage of medications.     MassPat reviewed.    Patient advised the NO REFILLS after hours, on Holidays or on weekends. NO EARLY REFILLS including for lost, destroyed or missing opiates or other controlled medications.      Narcan  prescription and instructions regarding proper use in an emergency previously provided to patient.           [1]   Allergies  Allergen Reactions    Penicillins Rash     Other  reaction(s): Not available   [2]   Current Outpatient Medications   Medication Sig Dispense Refill    acalabrutinib (Calquence) 100 mg chemo capsule Take 100 mg by mouth twice daily Swallow whole.      acetaminophen (Tylenol) 500 mg tablet Take 500-1,000 mg by mouth every 8 (eight) hours if needed (pain). 2 tabs BID PRN       acyclovir (Zovirax) 400 mg tablet Take 400 mg by mouth in the morning and 400 mg before bedtime.      albuterol 2.5 mg /3 mL (0.083 %) nebulizer solution Take 2.5 mg by nebulization every 4 (four) hours if needed for shortness of breath or wheezing. hold for HR greater than 120  Indications: bronchospasm prevention      alfuzosin (Uroxatral) 10 mg 24 hr tablet Take 10 mg by mouth at noon and 10 mg in the evening. Indications: enlarged prostate with urination problem      alum-mag hydroxide-simeth (Maalox Advanced) 200-200-20 mg/5 mL oral suspension Take 30 mL by mouth every 8 (eight) hours if needed for heartburn or indigestion. Indications: heartburn, indigestion      ascorbic acid (Vitamin C) 500 mg tablet Take 500 mg by mouth once daily. Indications: inadequate vitamin C      bicalutamide (Casodex) 50 mg chemo tablet Take 50 mg by mouth once daily. Indications: advanced form of prostate cancer      calcium carbonate 600 mg calcium (1,500 mg) tablet Take 1,500 mg by mouth once daily. Indications: osteoporosis, a condition of weak bones      cholecalciferol (Vitamin D-3) 25 MCG (1000 UT) capsule Take 25 mcg by mouth once daily.  Indications: supplement      diazePAM  (Valium ) 2 mg tablet Take 1 tablet (2 mg) by mouth if needed each day (prior to radiation procedure). 30 tablet 0    DILTIAZEM HCL ORAL Take 120 mg by mouth 1 (one) time each day. Indications: .      docusate sodium (Colace) 100 mg tablet Take 100 mg by mouth if needed in the morning and at bedtime for constipation. Indications: constipation      DULoxetine  (Cymbalta ) 30 mg DR capsule Take 1 capsule (30 mg) by mouth once daily. Do not crush or chew. 90 capsule 1    fluticasone propion-salmeteroL (Wixela Inhub) 250-50 mcg/dose diskus inhaler Inhale 1 puff in the morning and at bedtime. Indications: bronchospasm prevention with COPD      furosemide (Lasix) 20 mg tablet Take 20 mg by mouth twice daily.      gabapentin  (Neurontin ) 300 mg capsule Take 300 mg by mouth at bedtime.      multivitamin tablet Take 1 tablet by mouth once daily. Indications: treatment to prevent vitamin deficiency      naloxone  (Narcan ) 4 mg/0.1 mL nasal spray Administer 1 spray (4 mg) into affected nostril(s) if needed for opioid reversal. May repeat every 2-3 minutes if needed, alternating nostrils, until medical assistance becomes available. 1 each 0    omeprazole (PriLOSEC) 20 mg DR capsule Take 40 mg by mouth before breakfast. Do not crush or chew.      oxyCODONE  (Roxicodone ) 5 mg immediate release tablet Take 2.5-5 mg by mouth every 6 (six) hours if needed (pain). Indications: pain      polyethylene glycol (Glycolax) 17 gram packet Take 17 g by mouth once daily.      saccharomyces boulardii (Florastor) 250 mg capsule Take 250 mg by mouth twice daily. Indications: supplement      sennosides (  senna) 8.6 mg tablet Take 2 tablets by mouth twice daily. Indications: constipation      vibegron (Gemtesa) 75 mg tablet tablet Take 150 mg by mouth once daily.      morphine  CR (MS Contin ) 30 mg 12 hr tablet Take 1 tablet (30 mg) by mouth every 12 (twelve) hours. 60 tablet 0     No current facility-administered  medications for this visit.

## 2023-07-01 ENCOUNTER — Encounter: Admit: 2023-07-01 | Discharge: 2023-07-01 | Payer: MEDICARE

## 2023-07-01 NOTE — Home Health (Signed)
 Patient age/gender/living situation/Primary Dx for homecare/Reason for Admission: 83 year old male patient living in townhouse style home with his wife admitted to [facility] nursing for management of pleurx catheter. Patient has pmh of CLL, Prostate CA, Pain, Functional Decline, Weight loss, Depression, Anxiety, DOE, LE edema, Right Pleural Effusion    Current Problems requiring skilled service to be addressed by above interventions (clinical, mental, adl and functional mobility): Patient requires skilled nursing for skilled assessment of vital signs, pain level, resp status, management of pleurx catheter.  Patient alert and oriented x3 this visit, able to make needs known. Patient reports back pain 6/10 this visit - had MS Contin  dose increased by Fernanda Howell NP yesterday. Lscta spo2 95% room air. Patient denies cough, endorses sob with exertion. Patient reports fair appetite. Last bm today. Patient denies s/sx dysuria. All medications in home, patient maintains accurate current medication list. Patient with pleurx catheter to right flank - orders from Dr Mickiel Albany at [facility] to drain q4 days up to per session. serosanguinous drainage output this visit. Sterile dressing replaced per protocol. Patient tolerated well with no voiced complaints of pain/discomfort during tx. insertion site benign, no s/sx infection noted.       Psychosocial and cognitive issues identified which may impact plan of care: n/a    Any additional services requested based on risk assessment? patient requesting PT    Homebound Status: YES due to decreased strength/endurance, fall risk, infection risk 2/2 pleurx catheter    Patient Identified care representative: Glennette Lanius (spouse)    Reviewed DC plan at [facility] with:  Patient Yes  Caregiver/representative Yes       Call to MD(name) Andreas Kays NP to confirm Medications & Orders Obtained (who you spoke with): message left requesting call back, RN to follow up monday

## 2023-07-03 ENCOUNTER — Encounter: Admit: 2023-07-03 | Discharge: 2023-07-03 | Payer: MEDICARE

## 2023-07-03 NOTE — Home Health (Signed)
 PT initial assessment  Patient age/gender/living situation/Primary Dx for homecare/Reason for Admission:    83 yo male with hx of CLL, prostate CA s/p radiation, compression fxs (unrelated to falls), multiple kyphoplasties, T7 compression fx without kyphoplasty referred to VNA service for management of pleurex cath.  He lives wife and granddaughter in a multi level condo with 3 STE & rail. Bedroom /Bathroom  on second level with  7+6 steps  unilateral Railing.  PLOF:  independent ambulation in home without device,  utilized walking sticks outside of home.  I ADLs, He reports weakness.  Pain 6/10 in back.  Takes MS contin ,  Instructed in use of ice. Has f/u urology 07/11/23, cardiology 07/12/23 and oncology 07/13/23.    Current Problems requiring skilled service to be addressed by above interventions (clinical, mental, adl and functional mobility):    Home safety assessment completed.  No noted problems .  Has RW, cane, grab bars,  built in shower chair, walk in shower. rollator.     Upon initial assessment he presented with impaired balance, fall risk, increased pain,  decreased independent mobility. He was independent with bed mobility.  SBA sit/stand transfer, Ambulated 30 feet  x2 .  Ambulated 50 feet with rollator SBA .  UP/down stairs  with railing SBA.   Tinetti score 18/28 indicating fall risk.  HEP initiated . Written instructions left in home. He will benefit from PT to address above problems with goal of independent mobility.   PT revisit for continued HEP instruction, fall prevention, pain assessment, transfer,    gait and stair  training.    Psychosocial and cognitive issues identified which may impact plan of care: none    Any additional services requested based on risk assessment? none    Homebound Status: yes    Patient Identified care representative: wife, self    Reviewed DC plan at [facility] with:  Patient and wife      Call to Cephas Collier, FNP staff and PT POC approved

## 2023-07-04 ENCOUNTER — Encounter: Admit: 2023-07-04 | Discharge: 2023-07-04 | Payer: MEDICARE

## 2023-07-04 NOTE — Home Health (Signed)
 Skilled services required this visit to address clinical and/or functional declines resulting from the following problems: 83 year old male patient living in townhouse style home with his wife admitted to [facility] nursing for management of pleurx catheter. Patient has pmh of CLL, Prostate CA, Pain, Functional Decline, Weight loss, Depression, Anxiety, DOE, LE edema, Right Pleural Effusion    Patient progress toward goals this visit as evidenced by (compare to prior values): wife present during visit. A&Ox3. VSS.  c/o back pain which he takes MS contin  and oxycodone  for breakthrough pain- states ms contin  was just increased on Friday by palliative NP and it seems to be a little better. No pain at pleurx cath site. Drained for 350cc's serosang drainage. Tolerated procedure well. RA sat 100%. LS clear. No SOB. States appetite is improving and he is adjusting his bowel meds according to constipation or loose stool. Meds reveiwed. Denies questions. educated when to call [facility] vs PCP vs 911. Aware and in agreement with next visit for Friday 5/9.     Patient continues to require the skills of a Nursing  to address the following next visit: pleurx cath management/education, med assess    Patient is at risk of decline in the following areas if goals not met: infection , resp compromise

## 2023-07-06 ENCOUNTER — Encounter: Admit: 2023-07-06 | Discharge: 2023-07-06 | Payer: MEDICARE

## 2023-07-06 NOTE — Home Health (Signed)
 Skilled services required this visit to address clinical and/or functional declines resulting from the following problems: 83 yo male with hx of CLL, prostate CA s/p radiation, compression fxs (unrelated to falls), multiple kyphoplasties, T7 compression fx without kyphoplasty referred to VNA service for management of pleurex cath. He lives wife and granddaughter in a multi level condo with 3 STE & rail. Bedroom /Bathroom on second level with 7+6 steps unilateral Railing. PLOF: independent ambulation in home without device, utilized walking sticks outside of home. I ADLs, He reports weakness.  Cont back pain. Takes MS contin , instructed in use of ice. Has f/u urology 07/11/23, cardiology 07/12/23 and oncology 07/13/23.    Patient progress toward goals this visit as evidenced by (compare to prior values): Focus of this first visit since initial PT assessment was on gait training on uneven surfaces. Pt has borrowed rollator for amb outside. Pt demonstrates good pace and posture w rollator. Teaching performed for use of brakes when/if pt needs to sit on rollator. Pt tolerated amb >200 feet on uneven surfaces w rollator w no SOB noted, some fatigue reported.  Patient continues to require the skills of Physical Therapy  to address the following next visit: teaching for/progression of HEP for strength and balance.   Patient is at risk of decline in the following areas if goals not met: falls; decline in functional mobility necessitating increased level of assistance from family/caregivers

## 2023-07-07 ENCOUNTER — Encounter: Admit: 2023-07-07 | Discharge: 2023-07-07 | Payer: MEDICARE

## 2023-07-07 NOTE — Home Health (Signed)
 Skilled services required this visit to address clinical and/or functional declines resulting from the following problems: 83 year old male patient living in townhouse style home with his wife admitted to [facility] for management of pleurx catheter. Patient has pmh of CLL, Prostate CA, Pain, Functional Decline, Weight loss, Depression, Anxiety, DOE, LE edema, Right Pleural Effusion    Patient progress toward goals this visit as evidenced by (compare to prior values): wife present during visit. A&Ox3. VSS.  c/o 6/10 chronic back pain for which he takes ms contin  and oxycodone - which keeps it at "a tolerable level".  No pain at pleurx cath site. Drained for 275 cc's serosang drainage. Tolerated procedure well. RA sat 98%. LS clear. No SOB. States appetite is improving and he is adjusting his bowel meds according to constipation or loose stool. Meds reveiwed. Denies questions. educated when to call [facility] vs PCP vs 911. Aware and in agreement with next visit for Monday 5/12.     Patient continues to require the skills of a Nursing  to address the following next visit: pleurx cath management/education, med assess    Patient is at risk of decline in the following areas if goals not met: infection , resp compromise

## 2023-07-10 ENCOUNTER — Encounter: Admit: 2023-07-10 | Discharge: 2023-07-10 | Payer: MEDICARE

## 2023-07-10 NOTE — Home Health (Signed)
 Skilled services required this visit to address clinical and/or functional declines resulting from the following problems: 83 year old male patient living in townhouse style home with his wife admitted to [facility] nursing for management of pleurx catheter. Patient has pmh of CLL, Prostate CA, Pain, Functional Decline, Weight loss, Depression, Anxiety, DOE, LE edema, Right Pleural Effusion    Patient progress toward goals this visit as evidenced by (compare to prior values):  A&Ox3. VSS.  c/o back pain which he takes MS contin  and oxycodone  for breakthrough pain- currentlyusing heating pad which he states is helping. Aaron Aas No pain at pleurx cath site. Drained for 350cc's serosang drainage. Tolerated procedure well. RA sat 97% LS clear. No SOB. States appetite is improving and he is adjusting his bowel meds according to constipation or loose stool. Meds reveiwed. Denies questions. educated when to call [facility] vs PCP vs 911. Aware and in agreement with next visit for 5/15.    Patient continues to require the skills of a Nursing  to address the following next visit: pleurx cath management/education, med assess    Patient is at risk of decline in the following areas if goals not met: infection , resp compromise

## 2023-07-10 NOTE — Home Health (Signed)
 Skilled services required this visit to address clinical and/or functional declines resulting from the following problems: 83 yo male with hx of CLL, prostate CA s/p radiation, compression fxs (unrelated to falls), multiple kyphoplasties, T7 compression fx without kyphoplasty referred to [facility] for management of pleurex cath. He lives wife and granddaughter in a multi level condo with 3 STE & rail. Bedroom /Bathroom on second level with 7+6 steps unilateral Railing. PLOF: independent ambulation in home without device, utilized walking sticks outside of home. I ADLs, He reports weakness. Cont back pain. Takes MS contin , instructed in use of ice. Has f/u urology 07/11/23, cardiology 07/12/23 and oncology 07/13/23.   Patient progress toward goals this visit as evidenced by (compare to prior values): Focus of this visit was on cont gait/stair training on uneven surfaces. Pt able to demonstrate good safety on stairs using one railing and single point cane. Pt tolerated amb >200 feet on uneven surfaces w rollator w no SOB noted. Pt amb w good pacing and safety w rollator.  Vitals deferred as SN had visited earlier.  Patient continues to require the skills of Physical Therapy to address the following next visit: teaching for/progression of HEP for strength and balance.   Patient is at risk of decline in the following areas if goals not met: falls; decline in functional mobility necessitating increased level of assistance from family/caregivers

## 2023-07-11 ENCOUNTER — Encounter

## 2023-07-13 ENCOUNTER — Encounter: Admit: 2023-07-13 | Discharge: 2023-07-13 | Payer: MEDICARE

## 2023-07-13 LAB — CMP (EXT)
ALT/SGPT (EXT): 6 U/L (ref <42)
AST/SGOT (EXT): 11 U/L (ref <41)
Albumin (EXT): 4.3 g/dL (ref 3.5–5.2)
Alkaline Phosphatase (EXT): 67 U/L (ref 40–129)
Anion Gap (EXT): 10 mmol/L (ref 7–17)
BUN (EXT): 20 mg/dL (ref 6–23)
Bilirubin, Total (EXT): 0.5 mg/dL (ref 0.2–1.2)
CO2 (EXT): 27 mmol/L (ref 22–31)
CalciumCalcium (EXT): 9.1 mg/dL (ref 8.8–10.7)
Chloride (EXT): 103 mmol/L (ref 98–107)
Creatinine (EXT): 0.94 mg/dL (ref 0.50–1.20)
GFR Estimated (Calc) (EXT): 80 mL/min/{1.73_m2} (ref >59)
Globulin (EXT): 1.8 g/dL — ABNORMAL LOW (ref 2.3–4.2)
Glucose (EXT): 89 mg/dL (ref 70–100)
Potassium (EXT): 4.3 mmol/L (ref 3.4–5.1)
Protein (EXT): 6.1 g/dL — ABNORMAL LOW (ref 6.4–8.3)
Sodium (EXT): 140 mmol/L (ref 136–145)

## 2023-07-13 NOTE — Home Health (Signed)
 Skilled services required this visit to address clinical and/or functional declines resulting from the following problems: 83 year old male patient living in townhouse style home with his wife admitted to [facility] for management of pleurx catheter. Patient has pmh of CLL, Prostate CA, Pain, Functional Decline, Weight loss, Depression, Anxiety, DOE, LE edema, Right Pleural Effusion    Patient progress toward goals this visit as evidenced by (compare to prior values): Pt had appt at cardiology and oncology- no change to meds or plan of treamtent. Pt given guidelines for what output of pleurx cath must fall to, to be removed.  A&Ox3. VSS.  c/o back pain which he takes MS contin  and oxycodone  for breakthrough pain. No pain at pleurx cath site. Drained for 300cc's serosang drainage. Tolerated procedure well. RA sat 99% LS clear. No SOB. States appetite is improving and he is adjusting his bowel meds according to constipation or loose stool. Meds reveiwed. Denies questions. educated when to call [facility] vs PCP vs 911. Aware and in agreement with next visit.    Patient continues to require the skills of a Nursing  to address the following next visit: pleurx cath management/education, med assess    Patient is at risk of decline in the following areas if goals not met: infection , resp compromise                                  300

## 2023-07-14 ENCOUNTER — Encounter: Admit: 2023-07-14 | Discharge: 2023-07-14 | Payer: MEDICARE

## 2023-07-14 NOTE — Home Health (Signed)
 Skilled services required this visit to address clinical and/or functional declines resulting from the following problems: 83 yo male with hx of CLL, prostate CA s/p radiation, compression fxs (unrelated to falls), multiple kyphoplasties, T7 compression fx without kyphoplasty referred to [facility] service for management of pleurex cath. He lives wife and granddaughter in a multi level condo with 3 STE & rail. Bedroom /Bathroom on second level with 7+6 steps unilateral Railing. PLOF: independent ambulation in home without device, utilized walking sticks outside of home. I ADLs, He reports weakness. Cont back pain. Takes MS contin , instructed in use of ice. Has f/u urology 07/11/23, cardiology 07/12/23 and oncology 07/13/23.   Patient progress toward goals this visit as evidenced by (compare to prior values): Focus of this visit was on cont gait/stair training on uneven surfaces. Pt cont's to demonstrate good safety on stairs using one railing and single point cane. Pt tolerated amb longer distance on uneven surfaces w rollator w no SOB noted. Pt amb w good pacing and safety w rollator. Pt verbalizes that he knows standing strengthening and balance exercises taught to him on during prior episodes of home therapy and still has written/illustrated instructions for reference.  Patient continues to require the skills of Physical Therapy to address the following next visit: teaching for/progression of HEP for strength and balance; PT to assess patient status for potential d/c from skilled home services to outpatient PT if deemed appropriate.   Patient is at risk of decline in the following areas if goals not met: falls; decline in functional mobility necessitating increased level of assistance from family/caregivers

## 2023-07-17 ENCOUNTER — Encounter: Admit: 2023-07-17 | Discharge: 2023-07-17 | Payer: MEDICARE

## 2023-07-17 NOTE — Home Health (Signed)
 Skilled services required this visit to address clinical and/or functional declines resulting from the following problems: 83 year old male patient living in townhouse style home with his wife admitted to [facility] for management of pleurx catheter. Patient has pmh of CLL, Prostate CA, Pain, Functional Decline, Weight loss, Depression, Anxiety, DOE, LE edema, Right Pleural Effusion    Patient progress toward goals this visit as evidenced by (compare to prior values): Pt had PCP appt-  no change to meds or plan of treamtent, however, pt states that d/t pitting edema, his PCP told him he could "double his lasix for a little bit". BP on lower side- aware to continue to hydrate and stand/change position slowly. LS clear sat 99%.  A&Ox3.   c/o back pain which he takes MS contin  and oxycodone  for breakthrough pain- states he will ask Palliative care about increasing MS contin  at next visit 5/29, since his pain has been worse and he doesn't like to take the oxycodone  for breakthrough pain. Encouraged pt to call NP if feels he needs pain med increase sooner.  No pain at pleurx cath site. Drained for 325cc's serosang drainage. Pt did have pain toward end of draining- relieved with clamping.  States appetite is improving and he is adjusting his bowel meds according to constipation or loose stool. Meds reveiwed. Denies questions. educated when to call [facility] vs PCP vs 911. Aware and in agreement with next visit.    Patient continues to require the skills of a Nursing  to address the following next visit: pleurx cath management/education, med assess    Patient is at risk of decline in the following areas if goals not met: infection , resp compromise                                 325

## 2023-07-18 ENCOUNTER — Encounter

## 2023-07-20 ENCOUNTER — Encounter: Admit: 2023-07-20 | Discharge: 2023-07-20 | Payer: MEDICARE

## 2023-07-20 NOTE — Home Health (Signed)
 Skilled services required this visit to address clinical and/or functional declines resulting from the following problems: 83 year old male patient living in townhouse style home with his wife admitted to [facility] for management of pleurx catheter. Patient has pmh of CLL, Prostate CA, Pain, Functional Decline, Weight loss, Depression, Anxiety, DOE, LE edema, Right Pleural Effusion    Patient progress toward goals this visit as evidenced by (compare to prior values):  LS clear sat 99%.  A&Ox3.  Legs measuring sl larger than last visit. Pt said it is bothersome and wants RN to call PCP about increasing lasix. RN stated they may not do that depending on his kidney function and how his BP has been on the low side. In the meantime, pt will wear compression stockings and instructed to elevate above heart if possible or at least level. 6/10 chronic back pain which he takes MS contin  and oxycodone  for breakthrough pain- .  No pain at pleurx cath site. Drained for 325cc's serosang drainage- tolerated well.  States appetite is improving and he is adjusting his bowel meds according to constipation or loose stool. Meds reveiwed. Denies questions. educated when to call [facility] vs PCP vs 911. Aware and in agreement with next visit.    PCP office- left message with staff re: increased BLE edema    Patient continues to require the skills of a Nursing  to address the following next visit: pleurx cath management/education, med assess    Patient is at risk of decline in the following areas if goals not met: infection , resp compromise    upcoming appts:  Palliative care 5/29

## 2023-07-21 ENCOUNTER — Encounter

## 2023-07-24 ENCOUNTER — Encounter: Admit: 2023-07-24 | Discharge: 2023-07-24 | Payer: MEDICARE

## 2023-07-24 NOTE — Home Health (Signed)
 Skilled services required this visit to address clinical and/or functional declines resulting from the following problems: R pleurx drain.    Patient progress toward goals this visit as evidenced by (compare to prior values): VSS, LSC, HRR. R lung pleurx drained for amber/ dark brown clear fluid. Pt tolersated well. Post drain LSC, HRR, VSS. PLeurx site intact, no s/sx skin breakdown. No recent changes in meds. Chronic pain to back. No falls. Educated on pleurx complications, use of emergency blue clamp. Has 5-6     Patient continues to require the skills of a Nursing  to address the following next visit: pleurx drain 2x/week, assessmen/education.    Patient is at risk of decline in the following areas if goals not met: infections, pleurx complications.

## 2023-07-25 NOTE — Unmapped (Signed)
 I certify that this patient is confined to his/her home and needs intermittent skilled nursing care, physical therapy/ and/or speech therapy or continues to need occupational therapy.  This patient is under my care and I have authorized the services on this plan of care and will periodically review the plan. I further certify that this patient had a Face-to-Face Encounter performed by Dr. Sanz-Altamira, Murray Arnold MD on 5.15.25  that was related to the primary reason the patient requires Home Health services.

## 2023-07-25 NOTE — Unmapped (Signed)
 Verbal SOC was obtained on 07/01/23.

## 2023-07-26 ENCOUNTER — Encounter

## 2023-07-26 NOTE — Telephone Encounter (Signed)
 [phone] Matthew Werner                        Confirmed appt for 05/29 at 10am

## 2023-07-27 ENCOUNTER — Encounter: Admit: 2023-07-27 | Discharge: 2023-07-27 | Payer: MEDICARE

## 2023-07-27 ENCOUNTER — Encounter: Admit: 2023-07-27 | Payer: MEDICARE | Attending: Adult Health

## 2023-07-27 DIAGNOSIS — M549 Dorsalgia, unspecified: Secondary | ICD-10-CM

## 2023-07-27 MED ORDER — gabapentin (Neurontin) 300 mg capsule
300 | ORAL_CAPSULE | Freq: Three times a day (TID) | ORAL | 1 refills | 30.00000 days | Status: DC
Start: 2023-07-27 — End: 2023-08-31

## 2023-07-27 MED ORDER — morphine ER (MS Contin) 30 mg 12 hr tablet
30 | ORAL_TABLET | Freq: Two times a day (BID) | ORAL | 0 refills | 28.00000 days | Status: DC
Start: 2023-07-27 — End: 2023-08-24

## 2023-07-27 NOTE — Unmapped (Signed)
 Palliative Care  [address]  [address]  [place], [place] [address]  Dept: [phone]     Chief Complaint: Exploration of Treatment Options, CLL, Prostate CA, Pain, Functional Decline, Weight loss, Depression, Anxiety, DOE, LE edema, Right Pleural Effusion      Reason for visit: Symptom Management     Place of service: Home, spouse Marily Shows present     Type of visit (new/established): Established       Matthew Werner  83 y.o. 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 Kent Pear with LBP improvement but with persistent right thigh pain. Then with increasing difficulty lying flat for XRT for prostate CA and increasing pain, found more compression and underwent T10-T11 kyphoplasty/biopsy with Dr Wendy Hamel on 12/20. 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 In 01/2022 found with severe debilitating thoracolumbar pain below area of T10/T11 found no longer to be surgical candidate with recommendation to follow with palliative care for pain management while pursuing CA treatment and continued CLL surveillance. Today, patient being seen for follow up palliative care visit.      Since last palliative visit, patient increased LA pain management to Morphine  30mg  BID and reports use of Oxycodone  10mg  QD a couple times per week for breakthrough. Today, reporting 7/10 thoracic continuous achy pain in band across mid back and occasionally coming around sides. He notes pain from rib fractures have subsided. Patient sleeps well at night with no disruption d/t pain. During the day when he  standing more, talking walks around condo complex and doing chores around the home, pain can get to 7/10. He is due to see Orthopedic Dr. De Evens on 6/6. He had been participating in home PT and able to progress to outside walks and it has since d/c'd and he is maintaining HEP and walking outside on nice days.     Additionally, patient with increasing LE edema despite compression stockings, elevation and over last week LE edema has progressed to mid thighs. Conferred with primary RN this am that LE measurements and edema continue to worsen. Dr. Madelon Scheuermann 6/6 apt next week LE edema. BP last week was running lower 90-100's systolic but today BP at 120/64. He continues on regime of Lasix 40mg  QAM. Most recent CR at .94 with GFR at 80.         Allergies[1]      Current Medications[2]    Review of Systems   CONSTITUTION: no fever/chills, no fatigue, +daytime drowsiness-one nap per day, decent appetite currently 2-3 meals/day, portion sizes 25-50% less compared to 6 months ago, + weight loss 190lbs a year ago, 04/2022 160lbs, 05/2022 165lbs, 150lbs 6/24, 158lbs 07/2022, 167lbs 10/2022, 160lbs 11/2022, 158lbs 05/2023, 163lbs today   EYES: no changes in vision    ENT: no dysphagia, no odynophagia  CV: No CP, no palpitations, no dizziness, no lightheadedness, + bilateral LE edema -wears compression stockings  RESP: no cough, no sputum, reports no SOB at rest or with communication, no orthopnea, + DOE on moderate exertion, +pleurx catheter placement    GI: +reflux managed with omeprazole, no n/v, no constipation, no diarrhea, last BM yesterday,  no abdominal pain, no bowel incontinence  GU: no urinary incontinence, no dysuria, + urinary frequency managed with Gemtesa   NEURO:  no H/A's, no neuropathy, no asymmetrical weakness, + LE weakness   MUSCULOSKELETAL: + joint stiffness and pain in back   PSYCH/COGNITION: reports managed depression and anxiety with Duloxetine , no sleep disturbance, no STM loss, no confusion    MOBILITY/Functional Status/ADLs: transfers and ambulates on own, cane and walking sticks for long distances, performs own ADLs  DIET: occasional Boost with protein 2x/day, high protein diet    SKIN: no rash, no breakdown     Elder Abuse Screen: Negative     PH-Q2:     Not performed today       Advance Care Planning   HCP wife Benjamin Brands primary, daughter Aurea Leek alternate; MOLST completed to reflect DNR/DNI wishes, copy in the home             Care team:  Cephas Collier, NP PCP, [facility], [place]    Dr. Milly Almas, Oncologist, managing Prolia q65mos, Lupron injections, CLL (blood tests)  Dr. Madelon Scheuermann, [facility], [address], [place]  Dr. Rickard Charles, Radiation Oncologist, [facility]   Dr. Wendy Hamel, Neurosurgeon, [facility]  Dr. Thyra Flower, Cardiologist   Dr. Griffin Lecher, Urologist    Dr. [place] Snow, GI   Dr. Nestor Banter PA-C, Pulmonologist, [facility] [place]     PE:  BP 120/64   Pulse 76   Wt 73.9 kg   SpO2 98%   BMI 27.98 kg/m    PPS Score: 50%  CONSTITUTIONAL: sitting hunched in chair in living room, distressed at times with pain levels, pleasant and engaged   HEENT: sclera anicteric, conjunctiva non-injected, no oral lesions  CV: RRR, no JVD, 1-2 non pitting edema L>R through mid thighs (measurements per primary RN notes)   PULM: CTA, no accessory muscle use   GI:  +BS's, soft, NT/ND, no organomegaly    NEURO: A&Ox4, CN II-XII grossly intact, grip strength intact, UE strength 5/5, LE strength 5/5, speech fluent and clear    MUSCULOSKELETAL:  tenderness to mid thoracic back   PSYCH: mood/affect appropriate; judgement and insight intact; memory and recall of recent medical events intact    SKIN: no rash, no breakdown         LABS:  contains abnormal data CBC and differential  Order: [id number]  Component  Ref Range & Units 2 wk ago   WBC  4.00 - 10.00 K/uL 6.46   RBC  4.50 - 6.40 M/uL 3.48 Low    HGB  13.5 - 18.0 g/dL 40.9 Low    HCT  81.1 - 54.0 % 34  Low    PLT  150 - 450 K/uL 135 Low    MCV  80.0 - 100.0 fL 97.7   MCH  27.0 - 32.0 pg 32.2 High    MCHC  32.0 - 36.0 g/dL 91.4   RDW  78.2 - 95.6 % 13   MPV  8.4 - 12.0 fL 10   NRBC  0 /100 WBCs 0   ABSOLUTE NRBC  0 K/uL 0   DIFF METHOD Auto   NEUTS  48.0 - 76.0 % 41.5 Low    LYMPHS  18.0 - 41.0 % 50.2 High    MONOS  4.0 - 11.0 % 5.9   EOS  0.0 - 5.0 % 1.7   BASOS  0.0 - 1.5 % 0.5   % IMMATURE GRANS  0.0 - 1.0 %  0.2   ABSOLUTE NEUTS  1.92 - 7.60 K/uL 2.69   ABSOLUTE LYMPHS  0.72 - 4.10 K/uL 3.24   ABSOLUTE MONOS  0.16 - 1.10 K/uL 0.38   ABSOLUTE EOS  0.00 - 0.50 K/uL 0.11   ABSOLUTE BASOS  0.00 - 0.15 K/uL 0.03   ABS IMMATURE GRANS  0.00 - 0.10 K/uL 0.01   Resulting Agency [facility]     Specimen Collected: 07/13/23 10:34     ntains abnormal data CMP (EXT)  Order: [id number]   Status: Final result              Component  Ref Range & Units (hover) 2 wk ago  (07/13/23) 5 mo ago  (02/21/23) 5 mo ago  (02/09/23) 8 mo ago  (11/17/22) 11 mo ago  (08/22/22) 1 yr ago  (05/26/22) 1 yr ago  (03/02/22)   Sodium (EXT) 140 136 140 139 139 138 135 Low    Potassium (EXT) 4.3 4.0 4.6 3.9 4.1 4.6 4.0   Chloride (EXT) 103 95 Low  98 100 101 105 99   CO2 (EXT) 27 29 27 26 27 23 25    BUN (EXT) 20 20 27  High  19 20 20 17    Creatinine (EXT) 0.94 1.20 1.41 High  1.23 High  1.06 1.03 1.07   Glucose (EXT) 89 118 High  110 High  104 High  97 96 104 High    Albumin (EXT) 4.3 4.4 4.4 4.6 4.6 5.0 4.4   Protein (EXT) 6.1 Low  6.3 Low  6.6 6.2 Low  6.1 Low  6.6 6.1 Low    CalciumCalcium (EXT) 9.1 10.0 9.5 9.6 10.3 9.7 9.5   Alkaline Phosphatase (EXT) 67 198 High  388 High  111 64 72 96   Bilirubin, Total (EXT) 0.5 0.5 0.3 0.7 0.6 0.9 0.5   AST/SGOT (EXT) 11 12 16 16 11 13 11    ALT/SGPT (EXT) 6 6 49 High  7 8 6 5    Globulin (EXT) 1.8 Low  1.9 Low  2.2 Low  1.6 Low  1.5 Low  1.6 Low  1.7 Low    GFR Estimated (Calc) (EXT) 80 60 CM 50 Low  CM 59 Low  CM 70 CM 73 CM 70 CM   Comment: Estimated glomerular filtration rate calculated using the  CKD-EPI refit equation.   Anion Gap (EXT) 10 12 15 13 11 10 11    Resulting Agency [facility] [facility] [facility] [facility] [facility] [facility] [facility]             Specimen Collected: 07/13/23 10:34         IMAGING:  None recent available            Assessment:   83 y.o. 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 Patient with CLL progression relapse and large right pleural effusion showing CLL cells with good response to right pleurx catheter placement and started on Calquence 100mg  BID. Symptoms of pain in back are currently not managed at moderate and severe pain levels with movement during the day with recommendations below. He continues to benefit from palliative care for pain symptom management and longitudinal GOC in collaboration with providers.  THE PALLIATIVE CARE TEAM WILL CONTINUE TO VISIT TO PROVIDE SUPPORT FOR PATIENT AND FAMILY. FOR IMMEDIATE NEEDS, PT WILL CONTACT [facility], OR 911 AS PALLIATIVE CARE DOES NOT PROVIDE EMERGENCY CARE.     PLAN:  CLL: In relapse since 2022 with progression and large pleural effusion pleural fluid showing CLL cells with pleurx catheter placed and Calquence 100mg  BID. Follows with hem/onc Dr. Mickiel Albany.   Prostate CA: Completed XRT treatment in 02/2022. Continues on Lupron injections with Dr. Mickiel Albany x 3 years and Casodex 50mg  daily. Followed by Dr. Cheree Cords [facility].   Pain: Increase Gabapentin  300mg  TID and use Oxycodone  10mg  q6h PRN during pre-activity for breakthrough, continue MS Contin  30mg  q12h, and Tylenol 1000mg  BID PRN. Prolia q6months for bone building with Dr.  Mickiel Albany. [facility]s [facility] with Dr. Madelon Scheuermann for thoracic and lumbar cortisone shots, next apt 6/6.   Functional Decline: In home PT completed, keeps up with HEP and will transition to outpatient at [facility].    Weight Loss: 16% weight loss over last year recently with some fluid overload shifting. Encourage protein supplements BID and small frequent meals.   Depression with chronic pain: Continue with Duloxetine  30mg  QD.   Anxiety: Continue with Valium  2mg  QD PRN for severe anxiety or acute severe muscle spasms associated with intractable back pain. He has not required.   DOE/R Pleural Effusion: Continue with R pleurx catheter placement and draining 2x/week, Lasix daily, Advair Diskus 1 puff 2x daily and Albuterol inhaler PRN (has not required inhalers since drain placement. Follows with Pulmonology.   LE Edema: Increase Lasix 40 mg BID x3 days and then return to 40mg  daily. Will confer with PCP and monitoring BMP/kidney function, as well as, BPs and knows parameters not to take if BP <100/50.     Next palliative visit in 4-6 weeks     Total time of this visit encounter: 60 minutes, with the time spent on pre-visit chart review, face to face time with the patient, discussion and counseling with the patient and family, review of the case with the care team, and documentation in the medical record .    Medication Refill    Medications refilled per patient request for Morphine  CR 30mg  q12h     I am prescribing this medication for the management of symptoms related to intractable back pain     Activities of Daily Living: This medication allows for symptom relief, allowing the patient to continue performing their activities of daily living without impairment.    Adverse events: The patient is not suffering from any adverse events.    Aberrant drug-taking behavior: The patient is not displaying any aberrant drug-taking behavior.      Reviewed instructions for proper use and safe storage of  medications.     MassPat reviewed.    Patient advised the NO REFILLS after hours, on Holidays or on weekends. NO EARLY REFILLS including for lost, destroyed or missing opiates or other controlled medications.      Narcan  prescription and instructions regarding proper use in an emergency previously provided to patient.         [1]   Allergies  Allergen Reactions    Penicillins Rash     Other reaction(s): Not available   [2]   Current Outpatient Medications   Medication Sig Dispense Refill    acalabrutinib (Calquence) 100 mg chemo capsule Take 100 mg by mouth twice daily Swallow whole.      acetaminophen (Tylenol) 500 mg  tablet Take 500-1,000 mg by mouth every 8 (eight) hours if needed (pain). 2 tabs BID PRN       acyclovir (Zovirax) 400 mg tablet Take 400 mg by mouth in the morning and 400 mg before bedtime.      albuterol 2.5 mg /3 mL (0.083 %) nebulizer solution Take 2.5 mg by nebulization every 4 (four) hours if needed for shortness of breath or wheezing. hold for HR greater than 120  Indications: bronchospasm prevention      alfuzosin (Uroxatral) 10 mg 24 hr tablet Take 10 mg by mouth at noon and 10 mg in the evening. Indications: enlarged prostate with urination problem      alum-mag hydroxide-simeth (Maalox Advanced) 200-200-20 mg/5 mL oral suspension Take 30 mL by mouth every 8 (eight) hours if needed for heartburn or indigestion. Indications: heartburn, indigestion      ascorbic acid (Vitamin C) 500 mg tablet Take 500 mg by mouth once daily. Indications: inadequate vitamin C      bicalutamide (Casodex) 50 mg chemo tablet Take 50 mg by mouth once daily. Indications: advanced form of prostate cancer      calcium carbonate 600 mg calcium (1,500 mg) tablet Take 1,500 mg by mouth once daily. Indications: osteoporosis, a condition of weak bones      cholecalciferol (Vitamin D-3) 25 MCG (1000 UT) capsule Take 25 mcg by mouth once daily. Indications: supplement      diazePAM  (Valium ) 2 mg tablet Take 1 tablet (2 mg)  by mouth if needed each day (prior to radiation procedure). 30 tablet 0    DILTIAZEM HCL ORAL Take 120 mg by mouth 1 (one) time each day. Indications: .      docusate sodium (Colace) 100 mg tablet Take 100 mg by mouth if needed in the morning and at bedtime for constipation. Indications: constipation      DULoxetine  (Cymbalta ) 30 mg DR capsule Take 1 capsule (30 mg) by mouth once daily. Do not crush or chew. 90 capsule 1    fluticasone propion-salmeteroL (Wixela Inhub) 250-50 mcg/dose diskus inhaler Inhale 1 puff in the morning and at bedtime. Indications: bronchospasm prevention with COPD      furosemide (Lasix) 20 mg tablet Take 20 mg by mouth twice daily.      gabapentin  (Neurontin ) 300 mg capsule Take 300 mg by mouth at bedtime.      morphine  CR (MS Contin ) 30 mg 12 hr tablet Take 1 tablet (30 mg) by mouth every 12 (twelve) hours. 60 tablet 0    multivitamin tablet Take 1 tablet by mouth once daily. Indications: treatment to prevent vitamin deficiency      naloxone  (Narcan ) 4 mg/0.1 mL nasal spray Administer 1 spray (4 mg) into affected nostril(s) if needed for opioid reversal. May repeat every 2-3 minutes if needed, alternating nostrils, until medical assistance becomes available. 1 each 0    omeprazole (PriLOSEC) 20 mg DR capsule Take 40 mg by mouth before breakfast. Do not crush or chew.      oxyCODONE  (Roxicodone ) 5 mg immediate release tablet Take 2.5-5 mg by mouth every 6 (six) hours if needed (pain). Indications: pain      polyethylene glycol (Glycolax) 17 gram packet Take 17 g by mouth once daily.      sennosides (senna) 8.6 mg tablet Take 2 tablets by mouth twice daily. Indications: constipation      vibegron (Gemtesa) 75 mg tablet tablet Take 150 mg by mouth once daily.       No  current facility-administered medications for this visit.

## 2023-07-27 NOTE — Home Health (Signed)
 Skilled services required this visit to address clinical and/or functional declines resulting from the following problems: 83 year old male patient living in townhouse style home with his wife admitted to [facility] for management of pleurx catheter. Patient has pmh of CLL, Prostate CA, Pain, Functional Decline, Weight loss, Depression, Anxiety, DOE, LE edema, Right Pleural Effusion    Patient progress toward goals this visit as evidenced by (compare to prior values):  LS clear sat 99%.  A&Ox3.  Legs measuring  even sl larger than last visit. Had not heard back from PCP. Palliative NP Deborrah Fam was present at time of visit and she will order a few days of increased lasix. Aware pt will be seen by RN again on Monday 6/1.   6/10 chronic back pain which he takes MS contin  and oxycodone  for breakthrough pain- .Deborrah Fam is also increasing MS contin  dose since pt does not like to take oxycodone .  No pain at pleurx cath site. Drained for 300cc's serosang drainage- tolerated well- had a "twinge" at the very end. Has skin tear on top of head that happened this past weekend when he was out in the shed. Pt is applying antibiotic ointement and bandaids. No s/s infection noted.  States appetite is improving and he is adjusting his bowel meds according to constipation or loose stool. Meds reveiwed. Denies questions. educated when to call [facility] vs PCP vs 911. Aware and in agreement with next visit.    Patient continues to require the skills of a Nursing  to address the following next visit: pleurx cath management/education, med assess         Patient is at risk of decline in the following areas if goals not met: infection , resp compromise

## 2023-07-31 ENCOUNTER — Encounter: Admit: 2023-07-31 | Discharge: 2023-07-31 | Payer: MEDICARE

## 2023-07-31 NOTE — Home Health (Signed)
 Skilled services required this visit to address clinical and/or functional declines resulting from the following problems: 83 year old male patient living in townhouse style home with his wife admitted to [facility] for management of pleurx catheter. Patient has pmh of CLL, Prostate CA, Pain, Functional Decline, Weight loss, Depression, Anxiety, DOE, LE edema, Right Pleural Effusion    Patient progress toward goals this visit as evidenced by (compare to prior values):  LS clear sat 99%.  A&Ox3. BLE edema remains, but  measuring   sl smaller than last visit. Has finished a few days of increased lasix per palliative care.  Alice Ao NP from  Palliative care.  6/10 chronic back pain which he takes MS contin  and oxycodone  for breakthrough pain- gabapentin  was increased and pt is taking oxycodone  on a more regular schedule and that is helping with the pain during the day.   No pain at pleurx cath site. Drained for 300cc's serosang drainage- tolerated well.  Has skin tear on top of head- cleansed, applied xeroform and dsd. No s/s infection noted.  States appetite is improving and he is adjusting his bowel meds according to constipation or loose stool. Meds reveiwed. Denies questions. educated when to call [facility] vs PCP vs 911. Aware and in agreement with next visit.    Patient continues to require the skills of a Nursing  to address the following next visit: pleurx cath management/education, med assess    Patient is at risk of decline in the following areas if goals not met: infection , resp compromise

## 2023-08-03 ENCOUNTER — Encounter: Admit: 2023-08-03 | Discharge: 2023-08-03 | Payer: MEDICARE

## 2023-08-03 LAB — OCCULT BLOOD, FECAL (EXT): Occult Blood, Fecal (EXT): POSITIVE — AB

## 2023-08-03 NOTE — Home Health (Signed)
 Skilled services required this visit to address clinical and/or functional declines resulting from the following problems: 83 year old male patient living in townhouse style home with his wife admitted to [facility] nursing for management of pleurx catheter. Patient has pmh of CLL, Prostate CA, Pain, Functional Decline, Weight loss, Depression, Anxiety, DOE, LE edema, Right Pleural Effusion    Patient progress toward goals this visit as evidenced by (compare to prior values):  LS clear sat 97%.  A&Ox3. Pt states he's been having loose stools for about a week and has had blood in them the last several days.States this happened before in 2010 and he was admitted with diverticulitis, but it was also when the found and diagnosed him with CLL.  States he almost went to the ER yesterday because of this. Bp 76/40 HR 80. Skin warm and dry. Denies dizziness or lightheadedness. Called GI Dr in [place] who pt saw in the past and they stated he should go to ER.  No pain at pleurx cath site. Drained for 325cc's serosang drainage- tolerated well.  Pt's wife will take to [facility]. PCP office aware. Deborrah Fam NP aware.       Patient continues to require the skills of a Nursing  to address the following next visit: pleurx cath management/education, med assess    Patient is at risk of decline in the following areas if goals not met: infection , resp compromise

## 2023-08-06 ENCOUNTER — Encounter

## 2023-08-07 ENCOUNTER — Encounter

## 2023-08-07 ENCOUNTER — Encounter: Admit: 2023-08-07 | Discharge: 2023-08-07 | Payer: MEDICARE

## 2023-08-07 NOTE — Telephone Encounter (Signed)
 Called [phone] LVM to call back to set up a follow up appt to see [place]

## 2023-08-07 NOTE — Home Health (Signed)
 Skilled services required this visit to address clinical and/or functional declines resulting from the following problems: 83 year old male patient living in townhouse style home with his wife admitted to [facility] for management of pleurx catheter. Patient has pmh of CLL, Prostate CA, Pain, Functional Decline, Weight loss, Depression, Anxiety, DOE, LE edema, Right Pleural Effusion    Patient progress toward goals this visit as evidenced by (compare to prior values):  A&Ox3. VSS. Had some low readings yesterday- sl lightheaded while low BP- family monitoring and pt knows to increase fluid intake. Pt was [facility] at [facility] last week for low BP and blood in stool. Colonoscopy showed internal hemorroid. Was given fluids. H&H low but stable- no transfusion required. Pt states chronic pain at a 6/10- is taking MS contin  and oxycodone . Has not moved bowels since colonoscopy on Friday.  No pain at pleurx cath site. Drained for 400cc's serosang drainage- tolerated well.  Pt c/o BLE edema- measuring sl larger than last readings.     TT sent to [place] NP re: BLE edema/lasix-  She would like daily log of weights and am/pm BP.  Sent text to Wife.     Patient continues to require the skills of a Nursing  to address the following next visit: pleurx cath management/education, med assess, cvp assess    Patient is at risk of decline in the following areas if goals not met: infection , resp compromise, GI bleed

## 2023-08-08 NOTE — Telephone Encounter (Signed)
 [phone] Matthew Werner and scheduled a follow up appt for 07/03 at 12pm

## 2023-08-10 ENCOUNTER — Encounter: Admit: 2023-08-10 | Discharge: 2023-08-10 | Payer: MEDICARE

## 2023-08-10 NOTE — Home Health (Signed)
 Skilled services required this visit to address clinical and/or functional declines resulting from the following problems: 83 year old male patient living in townhouse style home with his wife admitted to [facility] for management of pleurx catheter. Patient has pmh of CLL, Prostate CA, Pain, Functional Decline, Weight loss, Depression, Anxiety, DOE, LE edema, Right Pleural Effusion    Patient progress toward goals this visit as evidenced by (compare to prior values):  A&Ox3. VSS. Pt is keeping log of BP and weight. Was seen at PCP office on Tues. Pt states he's having blood in stool again. Loose stool with blood mixed in . States he's attempted to call GI dr for f/u from ER stay. No call back. Denies dizziness, lightheadedness.  Pt states chronic pain at a 6/10- is taking MS contin  and oxycodone .  No pain at pleurx cath site. Drained for 250cc's serosang drainage- tolerated well. drainage and dressing change performed by RN Pattie Borders- Observed by this RN.  +BLE - compression stockings on. Measurements taken.     RN called GI office- asking for call back or appt for pt. Wife called RN later in day and stated GI called back and advised pt to go to [facility] ER.     Patient continues to require the skills of a Nursing  to address the following next visit: pleurx cath management/education, med assess, cvp assess    Patient is at risk of decline in the following areas if goals not met: infection , resp compromise, GI bleed

## 2023-08-14 ENCOUNTER — Encounter

## 2023-08-14 ENCOUNTER — Encounter: Admit: 2023-08-14 | Discharge: 2023-08-14 | Payer: MEDICARE

## 2023-08-14 NOTE — Home Health (Signed)
 Patient age/gender/living situation/Primary Dx for homecare/Reason for Admission:  83 year old male patient living in townhouse style home with his wife admitted to [facility] for management of pleurx catheter. Patient has pmh of CLL, Prostate CA, Pain, Functional Decline, Weight loss, Depression, Anxiety, DOE, LE edema, Right Pleural Effusion. Was recently in hospital x 2 for blood is stool. Being followed by GI. All test results at this point show hemorrhoids. H&H stable- no blood transfusion needed.     Current Problems requiring skilled service to be addressed by above interventions (clinical, mental, adl and functional mobility): Wife present during visit. A&Ox3. Pain in back tolerable with pain meds. Pleurx cath drained for 325cc's sero/sang fluid. Site without s/s infection. Pt is keeping log of Bp's and weight each morning. +BLE edema. WIfe on phone with PCP- making med changes. Will start on torsemide. Educarted on s/s bleeding- aware when to call [facility] vs PCP vs 911.     Psychosocial and cognitive issues identified which may impact plan of care: none    Any additional services requested based on risk assessment: none    Homebound Status: homebound    Patient Identified care representative: wife    Reviewed DC plan at [facility] with:  Patient - yes  Caregiver/representative- yes    Cephas Collier NP PCP are of [facility] for SN- med rec completed

## 2023-08-16 MED ORDER — oxyCODONE (Roxicodone) 10 mg immediate release tablet
10 | ORAL_TABLET | Freq: Four times a day (QID) | ORAL | 0 refills | 8.00000 days | Status: AC | PRN
Start: 2023-08-16 — End: 2023-09-15

## 2023-08-16 NOTE — Progress Notes (Signed)
 Medication Refill    Medications refilled per patient request for Oxycodone  10mg  q6h PRN     I am prescribing this medication for the management of symptoms related to intractable back pain     Activities of Daily Living: This medication allows for symptom relief, allowing the patient to continue performing their activities of daily living without impairment.    Adverse events: The patient is not suffering from any adverse events.    Aberrant drug-taking behavior: The patient is not displaying any aberrant drug-taking behavior.      Reviewed instructions for proper use and safe storage of medications.     MassPat reviewed.    Patient advised the NO REFILLS after hours, on Holidays or on weekends. NO EARLY REFILLS including for lost, destroyed or missing opiates or other controlled medications.      Narcan  prescription and instructions regarding proper use in an emergency previously provided to patient.

## 2023-08-17 ENCOUNTER — Encounter: Admit: 2023-08-17 | Discharge: 2023-08-17 | Payer: MEDICARE

## 2023-08-17 ENCOUNTER — Encounter

## 2023-08-17 NOTE — Home Health (Signed)
 Skilled services required this visit to address clinical and/or functional declines resulting from the following problems: 83 year old male patient living in townhouse style home with his wife admitted to [facility] for management of pleurx catheter. Patient has pmh of CLL, Prostate CA, Pain, Functional Decline, Weight loss, Depression, Anxiety, DOE, LE edema, Right Pleural Effusion    Patient progress toward goals this visit as evidenced by (compare to prior values):  A&Ox3. VSS.Lasix has been dc'd and changed to torsemide. States he's been urinating more. weight is down a couple of pounds today. Bp 90/50. Denies dizziness or lighheadedness. was outside with plants. Encouraged to drink fluids.  Pt is keeping log of BP and weight. States there is still some blood in his stool and he has a call in to the dr re: something he was using in the hospital for his hemorrhoids.  Pt states chronic pain at a 6/10- is taking MS contin  and oxycodone .  No pain at pleurx cath site. Drained for 225cc's serosang drainage- tolerated well.  +BLE - compression stockings on. Measurements taken.     Patient continues to require the skills of a Nursing  to address the following next visit: pleurx cath management/education, med assess, cvp assess    Patient is at risk of decline in the following areas if goals not met: infection , resp compromise, GI bleed

## 2023-08-21 ENCOUNTER — Encounter

## 2023-08-21 ENCOUNTER — Encounter: Admit: 2023-08-21 | Discharge: 2023-08-21 | Payer: MEDICARE

## 2023-08-21 NOTE — Home Health (Signed)
 Skilled services required this visit to address clinical and/or functional declines resulting from the following problems: PROSTATE CA/PLEURAL EFFUSION    Patient progress toward goals this visit as evidenced by (compare to prior values): HIS WT 166LB TODAY. PT REPORTED MID BACK ACHE AND MANAGE W/H MEDS/REST. LSC AND NO SOB. GI/GU WNL. PRE/POST VITALS WNL. BLE EDEMA +2 AND MEASUREMENT TAKEN. PLEURX  SITE CDI/PINK AND NO INFECTION. PLEURX DRAIN 260ML OF SEROSANGUINOUS FLUID CONTENT AND PT TOLERATED WELL.     Patient continues to require the skills of a NURSING  to address the following next visit: PLEURAL EFFUSION  Patient is at risk of decline in the following areas if goals not met: HOSPITALIZATION

## 2023-08-24 ENCOUNTER — Encounter: Admit: 2023-08-24 | Discharge: 2023-08-24 | Payer: MEDICARE

## 2023-08-24 LAB — CMP (EXT)
ALT/SGPT (EXT): 6 U/L (ref <42)
AST/SGOT (EXT): 12 U/L (ref <41)
Albumin (EXT): 4.4 g/dL (ref 3.5–5.2)
Alkaline Phosphatase (EXT): 62 U/L (ref 40–129)
Anion Gap (EXT): 10 mmol/L (ref 7–17)
BUN (EXT): 32 mg/dL — ABNORMAL HIGH (ref 6–23)
Bilirubin, Total (EXT): 0.4 mg/dL (ref 0.2–1.2)
CO2 (EXT): 28 mmol/L (ref 22–31)
CalciumCalcium (EXT): 9.1 mg/dL (ref 8.8–10.7)
Chloride (EXT): 97 mmol/L — ABNORMAL LOW (ref 98–107)
Creatinine (EXT): 1.2 mg/dL (ref 0.50–1.20)
GFR Estimated (Calc) (EXT): 60 mL/min/{1.73_m2} (ref >59)
Globulin (EXT): 1.7 g/dL — ABNORMAL LOW (ref 2.3–4.2)
Glucose (EXT): 125 mg/dL — ABNORMAL HIGH (ref 70–100)
Potassium (EXT): 4.4 mmol/L (ref 3.4–5.1)
Protein (EXT): 6.1 g/dL — ABNORMAL LOW (ref 6.4–8.3)
Sodium (EXT): 135 mmol/L — ABNORMAL LOW (ref 136–145)

## 2023-08-24 MED ORDER — morphine ER (MS Contin) 30 mg 12 hr tablet
30 | ORAL_TABLET | Freq: Two times a day (BID) | ORAL | 0 refills | 28.00000 days | Status: DC
Start: 2023-08-24 — End: 2023-09-27

## 2023-08-24 NOTE — Progress Notes (Signed)
 Medication Refill    Medications refilled per patient request for Morphine  CR 30mg  q12h     I am prescribing this medication for the management of symptoms related to severe back pain     Activities of Daily Living: This medication allows for symptom relief, allowing the patient to continue performing their activities of daily living without impairment.    Adverse events: The patient is not suffering from any adverse events.    Aberrant drug-taking behavior: The patient is not displaying any aberrant drug-taking behavior.      Reviewed instructions for proper use and safe storage of medications.     MassPat reviewed.    Patient advised the NO REFILLS after hours, on Holidays or on weekends. NO EARLY REFILLS including for lost, destroyed or missing opiates or other controlled medications.      Narcan  prescription and instructions regarding proper use in an emergency previously provided to patient.

## 2023-08-24 NOTE — Home Health (Signed)
 Skilled services required this visit to address clinical and/or functional declines resulting from the following problems: 83 year old male patient living in townhouse style home with his wife admitted to [facility] for management of pleurx catheter. Patient has pmh of CLL, Prostate CA, Pain, Functional Decline, Weight loss, Depression, Anxiety, DOE, LE edema, Right Pleural Effusion    Patient progress toward goals this visit as evidenced by (compare to prior values):  A&Ox3. VSS. Denies dizziness or lighheadedness.   Pt is keeping log of BP and weight. States there is still some blood in his stool occasionally and that he has been using a steroid suppository. Unsure if it's helping, but states he's had increased gas.   Pt states chronic pain at a 6/10- is taking MS contin  and oxycodone .  No pain at pleurx cath site. Drained for 250cc's serosang drainage- tolerated well.  +BLE - compression stockings on. Measurements taken.     Patient continues to require the skills of a Nursing  to address the following next visit: pleurx cath management/education, med assess, cvp assess    Patient is at risk of decline in the following areas if goals not met: infection , resp compromise, GI bleed    Upcoming appts:  7/2- GI dr margrett  6/26- oncology

## 2023-08-28 ENCOUNTER — Encounter

## 2023-08-28 ENCOUNTER — Encounter: Admit: 2023-08-28 | Discharge: 2023-08-28 | Payer: MEDICARE

## 2023-08-28 NOTE — Home Health (Signed)
 Home Health need continues for: 83 year old male patient  admitted to [facility] for management of pleurx catheter. Patient has pmh of CLL, Prostate CA, Pain, Functional Decline, Weight loss, Depression, Anxiety, DOE, LE edema, Right Pleural Effusion.     Primary diagnoses/co-morbidities/recent procedures in past 60 days that impact current episode: Was recently in hospital x 2 for blood is stool. Being followed by GI. All test results at this point show hemorrhoids. H&H stable- no blood transfusion needed. Continues to need SN for pleurx cath mngt 2 x week. +BLE edema- elevation/ compression stocking/diuretic education needed. Still with occasional bloody stools. Continues to monitor weight and VS and for s/s gi bleed. Pt prone to orthostatic hypotension r/t dehydration. needs continued education on limiting activities especially in warm weather and to hydrate. Chronic pain remains 5-6/10- continues with MS contin  and oxycodone .     Current level of functional ability: Uses walker or cane to ambulate, remains weak and unsteady at times. Has chronic back pain for which he takes scheduled and PRN opiates    Homebound status and living arrangements: homebound,  living in [place] style home with his wife     Goals accomplished and/or measurable progress toward unmet goals in past 60 days: no wound care infections, no falls    Focus of care for next 60 days for each discipline ordered: prevent infection, prevent hospital stays, cvp assess d/t hypotenstion, Pleurx cath drainage and dressing change, fall prevention, BLE edema mngt    Skin integrity/wound status: Pleurx cath to right chest remains intact with sutures. Dressing change 2x week per SN. Skin around cath intact.     Code status: DNR/DNI    Most recent fall risk:5    Plan of Care confirmed with [facility] office. Aware of need for continued SN services.     upcoming appts:  thurs noon- [facility] palliative 7/3  GI- wed 7/2

## 2023-08-29 NOTE — Telephone Encounter (Signed)
 [phone] Matthew Werner                                    Confirmed appt for Thur 07/03 at 12pm

## 2023-08-31 ENCOUNTER — Encounter: Admit: 2023-08-31 | Discharge: 2023-08-31 | Payer: MEDICARE

## 2023-08-31 ENCOUNTER — Encounter: Admit: 2023-08-31 | Payer: MEDICARE | Attending: Adult Health

## 2023-08-31 DIAGNOSIS — M549 Dorsalgia, unspecified: Principal | ICD-10-CM

## 2023-08-31 NOTE — Home Health (Signed)
 Skilled services required this visit to address clinical and/or functional declines resulting from the following problems: 83 year old male patient living in townhouse style home with his wife admitted to [facility] for management of pleurx catheter. Patient has pmh of CLL, Prostate CA, Pain, Functional Decline, Weight loss, Depression, Anxiety, DOE, LE edema, Right Pleural Effusion    Patient progress toward goals this visit as evidenced by (compare to prior values):  A&Ox3. 86/50 sitting and 70/40 standing.  Denies dizziness or lighheadedness. BLE edema almost resolved. Measurements smaller than last visit. Continues with torsemide 40 am and 20mg  afternoon. Pt will be seen by Saddie NP palliative care at noon today. Encouraged to rest and hydrate until then and get up slowly.  He is keeping log of BP and weight.  lowest at home BP reading shows SBP 100. Continues with blood in stools as of last evening. Had GI appt- recommending hemorroidectomy. States most recent labs were good.   Pt states chronic pain at a 6/10- is taking MS contin  and oxycodone .  No pain at pleurx cath site. Drained for 150cc's serosang drainage- tolerated well. Educated when to call [facility] vs PCP vs 911. Aware and in agreement with next visit on 7/7.      Patient continues to require the skills of a Nursing  to address the following next visit: pleurx cath management/education, med assess, cvp assess    Patient is at risk of decline in the following areas if goals not met: infection , resp compromise, GI bleed                                150cc's  dr larwence 9/18

## 2023-08-31 NOTE — Unmapped (Signed)
 Palliative Care  [address]  [address]  [place], [place] [address]  Dept: [phone]     Chief Complaint: Exploration of Treatment Options, CLL, Prostate CA, Pain, Functional Decline, Weight loss, Depression, Anxiety, DOE, LE edema, Right Pleural Effusion, Hypotension, Constipation        Reason for visit: Symptom Management, GOC    Place of service: Home, spouse Dagoberto present     Type of visit (new/established): Established        YEP:Tpoopjf Matthew Werner  83 y.o. 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 Margorie with LBP improvement but with persistent right thigh pain. Then with increasing difficulty lying flat for XRT for prostate CA and increasing pain, found more compression and underwent T10-T11 kyphoplasty/biopsy with Dr Cesar on 12/20. 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 In 01/2022 found with severe debilitating thoracolumbar pain below area of T10/T11 found no longer to be surgical candidate with recommendation to follow with palliative care for pain management while pursuing CA treatment and continued CLL surveillance. Today, patient being seen for follow up palliative care visit.     Since last palliative visit, patient with a couple hospital admissions for melena and seen by outpatient GI yesterday with thoughts towards internal hemorrhoids, diverticulosis, radiation proctitis and opioid induced constipation. GI prescribing Trulance samples 3mg  daily while awaiting approval for Relistor and referral for hemorrhoidectomy and if not thought to be primary  source of bleeding than recommendation for colonoscopy and treating radiation proctitis.     Today, receiving message from primary RN, patient is with hypotension with am reading of 86/50 sitting and 70/40 standing, asymptomatic with bilateral LE edema significantly decreased with Torsemide 40mg  QAM and 20mg  in afternoon on board in recent weeks. His pleurx catheter output at 150 today which is the lowest output it has been. Noting he has been feeling some achiness and cramping to extremities over last week attributing to Torsemide.      Back pain currently managed with Morphine  CR 30mg  BID and Oxycodone  10mg  QD PRN (usually taken in the am). We had increased gabapentin  to 300mg  TID at last visit and patient not finding it additionally helpful and would like to d/c. Patient unable to see orthopedic Dr. Hezzie this past month due to hospitalization for potential cortisone shots and rescheduled for coming weeks. He has been able to walk around home at 1-2 miles per day on non hot days and keeping up with HEP. Hoping to return to outpatient PT at [facility] if pleurx can be d/c'd.       Allergies[1]      Current Medications[2]    Review of Systems   CONSTITUTION: no fever/chills, no fatigue, +daytime drowsiness-one nap per day, decent appetite currently 2-3 meals/day, portion sizes 25-50% less compared to 6 months ago, + weight loss 190lbs a year ago, 04/2022 160lbs, 05/2022 165lbs, 150lbs 6/24, 158lbs 07/2022, 167lbs 10/2022, 160lbs 11/2022, 158lbs 05/2023, 163lbs 06/2023, 159.8lbs today   EYES: no changes in vision    ENT: no dysphagia, no odynophagia  CV: No CP, no palpitations, no dizziness, no lightheadedness, + bilateral LE edema -wears  compression stockings  RESP: no cough, no sputum, reports no SOB at rest or with communication, no orthopnea, + DOE on moderate exertion, +pleurx catheter placement    GI: +reflux managed with omeprazole, no n/v, no constipation, no diarrhea, last BM yesterday, no abdominal pain, no  bowel incontinence  GU: no urinary incontinence, no dysuria, + urinary frequency managed with Gemtesa   NEURO:  no H/A's, no neuropathy, no asymmetrical weakness, no LE weakness   MUSCULOSKELETAL: + joint stiffness and pain in back   PSYCH/COGNITION: reports managed depression and anxiety with Duloxetine , no sleep disturbance, no STM loss, no confusion   MOBILITY/Functional Status/ADLs: transfers and ambulates on own, cane and walking sticks for long distances, performs own ADLs with occasional supervision   DIET: occasional Boost with protein 2x/day, high protein diet    SKIN: no rash, no breakdown     Elder Abuse Screen: Negative     PH-Q2:     Not performed today       Advance Care Planning   HCP wife [name] primary, daughter [name] alternate; MOLST completed to reflect DNR/DNI wishes, copy in the home                Care team:  Delon Law, NP PCP, [facility], [place]    Dr. Raynell Mon, Oncologist, managing Prolia q54mos, Lupron injections, CLL (blood tests)  Dr. Veola, [facility], [address] [place]  Dr. Ryan Night, Radiation Oncologist, [facility]   Dr. Cesar, Neurosurgeon, [facility]  Dr. Nance, Cardiologist   Dr. Lonni Pilling, Urologist    Dr. Ronette, GI   Dr. Mitch Spark PA-C, Pulmonologist, [facility]     PE:  BP 98/54 (BP Location: Right arm, Patient Position: Sitting) Comment: 98/54 sitting, 94/50 standing  Pulse 68   Resp 18   Wt 72.5 kg   SpO2 96%   BMI 27.43 kg/m    PPS Score: 50%  CONSTITUTIONAL: sitting upright in living room chair, NAD, pleasant and engaged   HEENT: sclera anicteric, conjunctiva non-injected, no oral lesions  CV: RRR, no JVD, no edema with compression stockings in place   PULM: CTA, no accessory muscle use   GI:  +BS's, soft, NT/ND, no organomegaly    NEURO: A&Ox4, CN II-XII grossly intact, grip strength intact, UE strength 5/5, LE strength 5/5, speech fluent and clear    MUSCULOSKELETAL:   tenderness to mid thoracic back   PSYCH: mood/affect appropriate; judgement and insight intact; memory and recall of recent medical events intact    SKIN: no rash, no breakdown         LABS:  Contains abnormal data CBC and differential  Order: [id number]  Component  Ref Range & Units 7 d ago   WBC  4.00 - 10.00 K/uL 7.6   RBC  4.50 - 6.40 M/uL 3.4 Low    HGB  13.5 - 18.0 g/dL 89.3 Low    HCT  59.9 - 54.0 % 32.7 Low    PLT  150 - 450 K/uL 157   MCV  80.0 - 100.0 fL 96.2   MCH  27.0 - 32.0 pg 31.2   MCHC  32.0 - 36.0 g/dL 67.5   RDW  88.4 - 85.4 % 12.2   MPV  8.4 - 12.0 fL 10   NRBC  0 /100 WBCs 0   ABSOLUTE NRBC  0 K/uL 0   DIFF METHOD Auto   NEUTS  48.0 - 76.0 % 67.7  LYMPHS  18.0 - 41.0 % 21.3   MONOS  4.0 - 11.0 % 10.1   EOS  0.0 - 5.0 % 0.1   BASOS  0.0 - 1.5 % 0.1   % IMMATURE GRANS  0.0 - 1.0 % 0.7   ABSOLUTE NEUTS  1.92 - 7.60 K/uL 5.14   ABSOLUTE LYMPHS  0.72 - 4.10 K/uL 1.62   ABSOLUTE MONOS  0.16 - 1.10 K/uL 0.77   ABSOLUTE EOS  0.00 - 0.50 K/uL 0.01   ABSOLUTE BASOS  0.00 - 0.15 K/uL 0.01   ABS IMMATURE GRANS  0.00 - 0.10 K/uL 0.05   Resulting Agency [facility]     Specimen Collected: 08/24/23 14:08    Performed by: [facility] Last Resulted: 08/24/23 14:12   Received From: [facility]      ntains abnormal data CMP (EXT)  Order: [id number]   Status: Final result              Component  Ref Range & Units (hover) 7 d ago  (08/24/23) 1 mo ago  (07/13/23) 6 mo ago  (02/21/23) 6 mo ago  (02/09/23) 9 mo ago  (11/17/22) 1 yr ago  (08/22/22) 1 yr ago  (05/26/22)   Sodium (EXT) 135 Low  140 136 140 139 139 138   Potassium (EXT) 4.4 4.3 4.0 4.6 3.9 4.1 4.6   Chloride (EXT) 97 Low  103 95 Low  98 100 101 105   CO2 (EXT) 28 27 29 27 26 27 23    BUN (EXT) 32 High  20 20 27  High  19 20 20    Creatinine (EXT) 1.2 0.94 1.20 1.41 High  1.23 High  1.06 1.03   Glucose (EXT) 125 High  89 118 High  110 High  104 High  97 96   Albumin (EXT) 4.4 4.3 4.4 4.4 4.6 4.6 5.0   Protein (EXT) 6.1 Low   6.1 Low  6.3 Low  6.6 6.2 Low  6.1 Low  6.6   CalciumCalcium (EXT) 9.1 9.1 10.0 9.5 9.6 10.3 9.7   Alkaline Phosphatase (EXT) 62 67 198 High  388 High  111 64 72   Bilirubin, Total (EXT) 0.4 0.5 0.5 0.3 0.7 0.6 0.9   AST/SGOT (EXT) 12 11 12 16 16 11 13    ALT/SGPT (EXT) 6 6 6  49 High  7 8 6    Globulin (EXT) 1.7 Low  1.8 Low  1.9 Low  2.2 Low  1.6 Low  1.5 Low  1.6 Low    GFR Estimated (Calc) (EXT) 60 80 CM 60 CM 50 Low  CM 59 Low  CM 70 CM 73 CM   Comment: Estimated glomerular filtration rate calculated using the CKD-EPI refit equation.          IMAGING:  Underwent colonoscopy today, impression as follows:  Normal.  Subcentimeter polyp in the cecum was removed with cold forceps biopsy; placed 1 clip successfully; hemostasis achieved  Diverticulosis of mild severity in the sigmoid colon  Moderate erythematous mucosa in the distal rectum, suggestive of radiation proctitis  Medium hemorrhoids    Assessment:   83 y.o. 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 Patient with CLL progression relapse and large right pleural effusion showing CLL cells with  good response to right pleurx catheter placement and started on Calquence 100mg  BID. Symptoms of pain in back are currently managed with regime in place. He continues to benefit from palliative care for pain symptom management and longitudinal GOC in collaboration with providers.     THE PALLIATIVE CARE TEAM WILL CONTINUE TO VISIT TO PROVIDE SUPPORT FOR PATIENT AND FAMILY. FOR IMMEDIATE NEEDS, PT WILL CONTACT [facility], OR 911 AS PALLIATIVE CARE DOES NOT PROVIDE EMERGENCY CARE.     PLAN:  CLL: In relapse since 2022 with progression and large pleural effusion pleural fluid showing CLL cells with pleurx  catheter placed and Calquence 100mg  BID. Follows with hem/onc Dr. Kerstin.   Prostate CA: Completed XRT treatment in 02/2022. Continues on Lupron injections with Dr. Kerstin x 3 years and Casodex 50mg  daily. Followed by Dr. [facility].   Pain: Taper off Gabapentin  300mg  TID; Continue MS Contin  30mg  q12h, and Oxycodone  10mg  BID PRN. Prolia q6months for bone building with Dr. Kerstin. Keep up with bowel regime. [facility]. Veola for thoracic and lumbar cortisone shots.    Functional Decline: In home PT completed, keeps up with HEP and will transition to outpatient at [facility].    Weight Loss: 16% weight loss over last year recently with some fluid overload shifting. Encourage protein supplements BID and small frequent meals.   Depression with chronic pain: Continue with Duloxetine  30mg  QD.   Anxiety: Continue with Valium  2mg  QD PRN for severe anxiety or acute severe muscle spasms associated with intractable back pain. He has not required.   DOE/R Pleural Effusion: Continue with R pleurx catheter placement and draining 2x/week, Torsemide daily, Advair Diskus 1 puff 2x daily and Albuterol inhaler PRN (has not required inhalers since drain placement. Follows with Pulmonology.   LE Edema/Hypotension: Continue Torsemide 40mg  QAM, hold afternoon Torsemide 20mg  with BP <100/50. Patient to continue to track BP and Primary RN to continue to assess edema measurements at next visit to optimize  Torsemide dosing w/o adverse effects. Wear compression stockings.   Constipation: Continue to encourage importance of keeping up with bowel regime on daily basis while on opioids. Just started Trulance 3mg  daily until Relistor approved by insurance and educated on importance to keep Miralax and/or senna on board if no BM after 3 days and give feedback to providers to keep up with bowel regime and what is working.     Next palliative visit in 6 weeks     Total time of this visit  encounter: 60 minutes, with the time spent on pre-visit chart review, face to face time with the patient, discussion and counseling with the patient and family, review of the case with the care team, and documentation in the medical record .         [1]   Allergies  Allergen Reactions    Penicillins Rash     Other reaction(s): Not available   [2]   Current Outpatient Medications   Medication Sig Dispense Refill    acalabrutinib (Calquence) 100 mg chemo capsule Take 100 mg by mouth twice daily Swallow whole.      acetaminophen (Tylenol) 500 mg tablet Take 500-1,000 mg by mouth every 8 (eight) hours if needed (pain). 2 tabs BID PRN       acyclovir (Zovirax) 400 mg tablet Take 400 mg by mouth in the morning and 400 mg before bedtime.      albuterol 2.5 mg /3 mL (0.083 %) nebulizer  solution Take 2.5 mg by nebulization every 4 (four) hours if needed for shortness of breath or wheezing. hold for HR greater than 120  Indications: bronchospasm prevention      alfuzosin (Uroxatral) 10 mg 24 hr tablet Take 10 mg by mouth at noon and 10 mg in the evening. Indications: enlarged prostate with urination problem      alum-mag hydroxide-simeth (Maalox Advanced) 200-200-20 mg/5 mL oral suspension Take 30 mL by mouth every 8 (eight) hours if needed for heartburn or indigestion. Indications: heartburn, indigestion      ascorbic acid (Vitamin C) 500 mg tablet Take 500 mg by mouth once daily. Indications: inadequate vitamin C      bicalutamide (Casodex) 50 mg chemo tablet Take 50 mg by mouth once daily. Indications: advanced form of prostate cancer      calcium carbonate 600 mg calcium (1,500 mg) tablet Take 1,500 mg by mouth once daily. Indications: osteoporosis, a condition of weak bones      cholecalciferol (Vitamin D-3) 25 MCG (1000 UT) capsule Take 25 mcg by mouth once daily. Indications: supplement      diazePAM  (Valium ) 2 mg tablet Take 1 tablet (2 mg) by mouth if needed each day (prior to radiation procedure). 30 tablet 0     DILTIAZEM HCL ORAL Take 120 mg by mouth 1 (one) time each day. Indications: .      docusate sodium (Colace) 100 mg tablet Take 100 mg by mouth if needed in the morning and at bedtime for constipation. Indications: constipation      DULoxetine  (Cymbalta ) 30 mg DR capsule Take 1 capsule (30 mg) by mouth once daily. Do not crush or chew. 90 capsule 1    fluticasone propion-salmeteroL (Wixela Inhub) 250-50 mcg/dose diskus inhaler Inhale 1 puff in the morning and at bedtime. Indications: bronchospasm prevention with COPD      furosemide (Lasix) 20 mg tablet Take 40 mg by mouth twice daily. take 40mg  in the morning and 20mg  in the afternoon for total of 60mg       morphine  ER (MS Contin ) 30 mg 12 hr tablet Take 1 tablet (30 mg) by mouth every 12 (twelve) hours. 60 tablet 0    multivitamin tablet Take 1 tablet by mouth once daily. Indications: treatment to prevent vitamin deficiency      naloxone  (Narcan ) 4 mg/0.1 mL nasal spray Administer 1 spray (4 mg) into affected nostril(s) if needed for opioid reversal. May repeat every 2-3 minutes if needed, alternating nostrils, until medical assistance becomes available. 1 each 0    omeprazole (PriLOSEC) 20 mg DR capsule Take 40 mg by mouth before breakfast. Do not crush or chew.      oxyCODONE  (Roxicodone ) 10 mg immediate release tablet Take 1 tablet (10 mg) by mouth every 6 (six) hours if needed (pain). 60 tablet 0    polyethylene glycol (Glycolax) 17 gram packet Take 17 g by mouth once daily.      sennosides (senna) 8.6 mg tablet Take 2 tablets by mouth twice daily. Indications: constipation      torsemide 40 mg tablet Take 40 mg by mouth in the morning and 40 mg at noon. 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       No current facility-administered medications for this visit.

## 2023-09-04 ENCOUNTER — Encounter: Admit: 2023-09-04 | Discharge: 2023-09-04 | Payer: MEDICARE

## 2023-09-04 NOTE — Home Health (Signed)
 Skilled services required this visit to address clinical and/or functional declines resulting from the following problems: 83 year old male patient living in townhouse style home with his wife admitted to [facility] for management of pleurx catheter. Patient has pmh of CLL, Prostate CA, Pain, Functional Decline, Weight loss, Depression, Anxiety, DOE, LE edema, Right Pleural Effusion    Patient progress toward goals this visit as evidenced by (compare to prior values):  A&Ox3. VSS. Denies dizziness or lighheadedness. BLE edema almost resolved. Was seen by Saddie NP last week- afternoon dose of torsemide dc'd. He is keeping log of BP and weight.  BP has improved since torsemide decreased.   Pt states chronic pain at a 6/10- is taking MS contin  and oxycodone . BM's have become more regular while on trulance, which was given to him by GI dr while waiting approval for  relistore. Pt informed relistore was approved, but when called pharmacy , the order was dc'd. Pt to call GI dr today.  No pain at pleurx cath site. Drained for 200cc's serosang drainage- tolerated well. Educated when to call [facility] vs PCP vs 911. Aware and in agreement with plan for next visit.     Patient continues to require the skills of a Nursing  to address the following next visit: pleurx cath management/education, med assess, cvp assess    Patient is at risk of decline in the following areas if goals not met: infection , resp compromise, GI bleed

## 2023-09-07 ENCOUNTER — Encounter: Admit: 2023-09-07 | Discharge: 2023-09-07 | Payer: MEDICARE

## 2023-09-07 NOTE — Unmapped (Signed)
 I certify that this patient is confined to home and needs intermittent skilled nursing care, physical therapy and/or speech therapy or continues to need occupational therapy. The patient is under my care, and I have authorized the services on this plan of care and will periodically review the plan.

## 2023-09-07 NOTE — Home Health (Signed)
 Skilled services required this visit to address clinical and/or functional declines resulting from the following problems: 83 year old male patient living in townhouse style home with his wife admitted to [facility] for management of pleurx catheter. Patient has pmh of CLL, Prostate CA, Pain, Functional Decline, Weight loss, Depression, Anxiety, DOE, LE edema, Right Pleural Effusion    Patient progress toward goals this visit as evidenced by (compare to prior values):  A&Ox3. VSS.  Pt took first dose of relistore this morning and had explosive diarrhea and stomach pains right after. Will call GI dr and let them know and will also ask for script for trulance since that worked. Denies dizziness or lighheadedness. Pt states chronic pain at a 6/10- is taking MS contin  and oxycodone .  No pain at pleurx cath site. Drained for 250 cc's serosang drainage- tolerated well. Educated when to call [facility] vs PCP vs 911. Aware and in agreement with plan for next visit.     Patient continues to require the skills of a Nursing  to address the following next visit: pleurx cath management/education, med assess, cvp assess    Patient is at risk of decline in the following areas if goals not met: infection , resp compromise, GI bleed    Upcoming appts:  dr verlinda  7/18 @2pm  [place]-  GI surgeon

## 2023-09-11 ENCOUNTER — Encounter: Admit: 2023-09-11 | Discharge: 2023-09-11 | Payer: MEDICARE

## 2023-09-11 NOTE — Home Health (Signed)
 Skilled services required this visit to address clinical and/or functional declines resulting from the following problems: PROSTATE CA/PLEURAL EFFUSION/WOUND ASSESS& CARE    Patient progress toward goals this visit as evidenced by (compare to prior values): HIS WT 158LB TODAY.LSC AND NO SOB. GI/GU WNL. PT NO BLE EDEMA. HE HAS BEEN ELEVATING/WEAR COMPRESSION SOCKS TO MINIMIZE SWEELING. PLEURX SITE CDI/SUTRES PATENT/NO INFECTION. PLEURX DRAIN 260 CC SEROSANGUINOUS CONTENT. VS B/F AND AFTER STABLE.  Patient continues to require the skills of a NURSING  to address the following next visit: PLEURX  Patient is at risk of decline in the following areas if goals not met: HOSPITALIZATION

## 2023-09-14 ENCOUNTER — Encounter: Admit: 2023-09-14 | Discharge: 2023-09-14 | Payer: MEDICARE

## 2023-09-14 ENCOUNTER — Encounter

## 2023-09-14 NOTE — Home Health (Signed)
 Skilled services required this visit to address clinical and/or functional declines resulting from the following problems: 83 year old male patient living in townhouse style home with his wife admitted to [facility] for management of pleurx catheter. Patient has pmh of CLL, Prostate CA, Pain, Functional Decline, Weight loss, Depression, Anxiety, DOE, LE edema, Right Pleural Effusion    Patient progress toward goals this visit as evidenced by (compare to prior values):  A&Ox3. VSS.  Has not taken relistore in 7 days. Is taking trulance. States he had first formed BM today.  Denies dizziness or lighheadedness.No edema noted- has compression stockings on . Pt states chronic pain at a 6/10- is taking MS contin  and oxycodone .  No pain at pleurx cath site. Drained for 200 cc's serosang drainage- tolerated well. Educated when to call [facility] vs PCP vs 911. Aware and in agreement with plan for next visit.     Patient continues to require the skills of a Nursing  to address the following next visit: pleurx cath management/education, med assess, cvp assess    Patient is at risk of decline in the following areas if goals not met: infection , resp compromise, GI bleed    Upcoming appts:    dr verlinda  7/18 @2pm  [place]-  GI surgeon

## 2023-09-18 ENCOUNTER — Encounter

## 2023-09-18 ENCOUNTER — Encounter: Admit: 2023-09-18 | Discharge: 2023-09-18 | Payer: MEDICARE

## 2023-09-18 NOTE — Home Health (Signed)
 Skilled services required this visit to address clinical and/or functional declines resulting from the following problems: 83 year old male patient living in townhouse style home with his wife admitted to [facility] for management of pleurx catheter. Patient has pmh of CLL, Prostate CA, Pain, Functional Decline, Weight loss, Depression, Anxiety, DOE, LE edema, Right Pleural Effusion    Patient progress toward goals this visit as evidenced by (compare to prior values):  A&Ox3. VSS.   taking trulance and is having BM's daily.   Denies dizziness or lighheadedness.No edema noted- has compression stockings on . Pt states chronic pain at a 6/10- is taking MS contin  and oxycodone .  No pain at pleurx cath site. Drained for 275 cc's serosang drainage- tolerated well. Educated when to call [facility] vs PCP vs 911. Aware and in agreement with plan for next visit.     Patient continues to require the skills of a Nursing  to address the following next visit: pleurx cath management/education, med assess, cvp assess    Patient is at risk of decline in the following areas if goals not met: infection , resp compromise, GI bleed    upcoming appts:  pain dr Friday

## 2023-09-21 ENCOUNTER — Encounter: Admit: 2023-09-21 | Discharge: 2023-09-21 | Payer: MEDICARE

## 2023-09-21 ENCOUNTER — Encounter

## 2023-09-21 NOTE — Home Health (Signed)
 Skilled services required this visit to address clinical and/or functional declines resulting from the following problems: PLEURAL EFFUSION/PROSTATE CA/DRESSING CHANGE PLEURX    Patient progress toward goals this visit as evidenced by (compare to prior values): PT REPROTED USUAL MODERATE BACK ACHE AND TOOK PAIN MEDS/REST FOR RELIEF. LSC AND NO SOB. GI/GU WNL. NO BLE EDEMA, +PP. PLEURX SITE CDI/PINK/NO INFECTION. PLEURX DRAIN 280CC SEROSANGUINOUS CONTENT AND PT TOLERATED WELL. VSS PRE/POST.    Patient continues to require the skills of a NURSING to address the following next visit: PLEURAL EFFUSION/PROSTATE CA/PLEURX DRESSING  Patient is at risk of decline in the following areas if goals not met: HOSPITALIZATION

## 2023-09-25 ENCOUNTER — Encounter: Admit: 2023-09-25 | Discharge: 2023-09-25 | Payer: MEDICARE

## 2023-09-25 ENCOUNTER — Encounter

## 2023-09-25 NOTE — Home Health (Signed)
 Skilled services required this visit to address clinical and/or functional declines resulting from the following problems: PLEURAL EFFUSION /PROSTATE CA    Patient progress toward goals this visit as evidenced by (compare to prior values): PT NO PAIN AND SOME DISCOMFORT TO HIS USUAL BACKACHE/TAKES MEDS/REST. LSC AND NO SOB. GI/GU WNL. NO BLE EDEMA. PT HAS BEEN WEARING COMPRESSION SOCKS. VS TAKEN PRE/POST WNL, BP LOW SIDE, PT ASYMPTOMATIC. PLEURX SITE CDI/PINK/SUTURES PATENT/NO INFECTION. PLEURX DRAIN 300 ML OF SEROSANGUINOUS FLUIDS AND PT TOLERATED WELL.    Patient continues to require the skills of a NURSING  to address the following next visit: PLEURAL EFFUSION  Patient is at risk of decline in the following areas if goals not met: HOSPITALIZATION

## 2023-09-27 MED ORDER — morphine ER (MS Contin) 30 mg 12 hr tablet
30 | ORAL_TABLET | Freq: Two times a day (BID) | ORAL | 0 refills | 28.00000 days | Status: DC
Start: 2023-09-27 — End: 2023-10-25

## 2023-09-27 NOTE — Progress Notes (Signed)
 Medication Refill    Medications refilled per patient request for MS Contin  30mg  q12h     I am prescribing this medication for the management of symptoms related to severe back pain     Activities of Daily Living: This medication allows for symptom relief, allowing the patient to continue performing their activities of daily living without impairment.    Adverse events: The patient is not suffering from any adverse events.    Aberrant drug-taking behavior: The patient is not displaying any aberrant drug-taking behavior.      Reviewed instructions for proper use and safe storage of medications.     MassPat reviewed.    Patient advised the NO REFILLS after hours, on Holidays or on weekends. NO EARLY REFILLS including for lost, destroyed or missing opiates or other controlled medications.      Narcan  prescription and instructions regarding proper use in an emergency previously provided to patient.

## 2023-09-28 ENCOUNTER — Encounter: Admit: 2023-09-28 | Discharge: 2023-09-28 | Payer: MEDICARE

## 2023-09-28 ENCOUNTER — Encounter

## 2023-09-28 MED ORDER — oxyCODONE (Roxicodone) 10 mg immediate release tablet
10 | ORAL_TABLET | Freq: Four times a day (QID) | ORAL | 0 refills | 8.00000 days | Status: AC | PRN
Start: 2023-09-28 — End: 2023-10-28

## 2023-09-28 NOTE — Home Health (Signed)
 Skilled services required this visit to address clinical and/or functional declines resulting from the following problems: 83 year old male patient living in townhouse style home with his wife admitted to [facility] for management of pleurx catheter. Patient has pmh of CLL, Prostate CA, Pain, Functional Decline, Weight loss, Depression, Anxiety, DOE, LE edema, Right Pleural Effusion    Patient progress toward goals this visit as evidenced by (compare to prior values):  A&Ox3. VSS. States BP has been running low. Asymptomatic and has increased his fluid intake. Had nerve block placed in back  on 7/29- states pain at site remains the same, but other pain in his back feels better. WIll have injection in right hip next. taking trulance and is having BM's daily. States no obvious blood noted.  Denies dizziness or lighheadedness. sl edema noted in left ankle. Will apply compression stockings.  Pt states chronic pain at a 6/10- is taking MS contin  and oxycodone .  Conitnues on torsemide 40mg  QAM. No pain at pleurx cath site. Drained for 225 cc's serosang drainage- tolerated well. Educated when to call [facility] vs PCP vs 911. Aware and in agreement with plan for next visit.     Patient continues to require the skills of a Nursing  to address the following next visit: pleurx cath management/education, med assess, cvp assess    Patient is at risk of decline in the following areas if goals not met: infection , resp compromise, GI bleed

## 2023-09-28 NOTE — Progress Notes (Signed)
 Medication Refill    Medications refilled per patient request for Oxycodone  10mg  q6h PRN     I am prescribing this medication for the management of symptoms related to severe back pain     Activities of Daily Living: This medication allows for symptom relief, allowing the patient to continue performing their activities of daily living without impairment.    Adverse events: The patient is not suffering from any adverse events.    Aberrant drug-taking behavior: The patient is not displaying any aberrant drug-taking behavior.      Reviewed instructions for proper use and safe storage of medications.     [employer] reviewed.    Patient advised the NO REFILLS after hours, on Holidays or on weekends. NO EARLY REFILLS including for lost, destroyed or missing opiates or other controlled medications.      Narcan  prescription and instructions regarding proper use in an emergency previously provided to patient.

## 2023-10-02 ENCOUNTER — Encounter: Admit: 2023-10-02 | Discharge: 2023-10-02 | Payer: MEDICARE

## 2023-10-02 ENCOUNTER — Encounter

## 2023-10-02 NOTE — Home Health (Signed)
 Skilled services required this visit to address clinical and/or functional declines resulting from the following problems: 83 year old male patient living in townhouse style home with his wife admitted to [facility] for management of pleurx catheter. Patient has pmh of CLL, Prostate CA, Pain, Functional Decline, Weight loss, Depression, Anxiety, DOE, LE edema, Right Pleural Effusion    Patient progress toward goals this visit as evidenced by (compare to prior values):  A&Ox3. VSS. States BP continues to run low. Asymptomatic and has increased his fluid intake. Had nerve block placed in back  on 7/29- has helped pain slightly but does state that when he has pain, and he sits and rests, that it goes away more quickly.  taking trulance and is having BM's daily. States no obvious blood noted.  Denies dizziness or lighheadedness.   Pt states chronic pain at a 6/10- is taking MS contin  and oxycodone .  Conitnues on torsemide 40mg  QAM. No pain at pleurx cath site. Drained for 225 cc's serosang drainage- tolerated well. Educated when to call [facility] vs PCP vs 911. Aware and in agreement with plan for next visit.     Patient continues to require the skills of a Nursing  to address the following next visit: pleurx cath management/education, med assess, cvp assess    Patient is at risk of decline in the following areas if goals not met: infection , resp compromise, GI bleed

## 2023-10-05 ENCOUNTER — Encounter

## 2023-10-05 ENCOUNTER — Encounter: Admit: 2023-10-05 | Discharge: 2023-10-05 | Payer: MEDICARE

## 2023-10-05 NOTE — Home Health (Signed)
 Skilled services required this visit to address clinical and/or functional declines resulting from the following problems: 83 year old male patient living in townhouse style home with his wife admitted to [facility] for management of pleurx catheter. Patient has pmh of CLL, Prostate CA, Pain, Functional Decline, Weight loss, Depression, Anxiety, DOE, LE edema, Right Pleural Effusion    Patient progress toward goals this visit as evidenced by (compare to prior values):  A&Ox3. VSS. 98/58. States BP has been running low. Asymptomatic and has increased his fluid intake. taking trulance and is having BM's daily. States no obvious bleeding for approx 2 weeks.   Denies dizziness or lighheadedness. BLE without edema.  Pt states chronic pain at a 6/10- is taking MS contin  and oxycodone .  Conitnues on torsemide 40mg  QAM. No pain at pleurx cath site. Drained for 225 cc's serosang drainage- tolerated well. Educated when to call [facility] vs PCP vs 911. Aware and in agreement with plan for next visit.     Patient continues to require the skills of a Nursing  to address the following next visit: pleurx cath management/education, med assess, cvp assess    Patient is at risk of decline in the following areas if goals not met: infection , resp compromise, GI bleed                          Next frid Dr. margrett GI  thur after [facility] palliateive

## 2023-10-09 ENCOUNTER — Encounter

## 2023-10-09 ENCOUNTER — Encounter: Admit: 2023-10-09 | Discharge: 2023-10-09 | Payer: MEDICARE

## 2023-10-09 NOTE — Home Health (Signed)
 Skilled services required this visit to address clinical and/or functional declines resulting from the following problems: 83 year old male patient living in townhouse style home with his wife admitted to [facility] for management of pleurx catheter. Patient has pmh of CLL, Prostate CA, Pain, Functional Decline, Weight loss, Depression, Anxiety, DOE, LE edema, Right Pleural Effusion    Patient progress toward goals this visit as evidenced by (compare to prior values):  A&Ox3. VSS. taking trulance and is having BM's daily. States no obvious bleeding for approx 3 weeks.   Denies dizziness or lighheadedness. BLE without edema.  Pt states chronic pain at a 6/10- is taking MS contin  and oxycodone .  Conitnues on torsemide 40mg  QAM. No pain at pleurx cath site. Drained for 150 cc's serosang drainage- tolerated well. Educated when to call [facility] vs PCP vs 911. Aware and in agreement with plan for next visit.     Patient continues to require the skills of a Nursing  to address the following next visit: pleurx cath management/education, med assess, cvp assess    Patient is at risk of decline in the following areas if goals not met: infection , resp compromise, GI bleed    upcoming appts:  THurs 1pm [facility] palliative  Friday- GI

## 2023-10-11 NOTE — Telephone Encounter (Signed)
 [phone] Matthew Werner                                          Confirmed appt  for 08/14 at 1pm

## 2023-10-12 ENCOUNTER — Encounter

## 2023-10-12 ENCOUNTER — Encounter: Admit: 2023-10-12 | Discharge: 2023-10-12 | Payer: MEDICARE

## 2023-10-12 ENCOUNTER — Encounter: Admit: 2023-10-12 | Payer: MEDICARE | Attending: Adult Health

## 2023-10-12 DIAGNOSIS — M549 Dorsalgia, unspecified: Principal | ICD-10-CM

## 2023-10-12 MED ORDER — morphine ER (MS Contin) 15 mg 12 hr tablet
15 | ORAL_TABLET | Freq: Every day | ORAL | 0 refills | 28.00000 days | Status: DC
Start: 2023-10-12 — End: 2023-11-22

## 2023-10-12 NOTE — Home Health (Signed)
 Skilled services required this visit to address clinical and/or functional declines resulting from the following problems: 83 year old male patient living in townhouse style home with his wife admitted to [facility] for management of pleurx catheter. Patient has pmh of CLL, Prostate CA, Pain, Functional Decline, Weight loss, Depression, Anxiety, DOE, LE edema, Right Pleural Effusion    Patient progress toward goals this visit as evidenced by (compare to prior values):  A&Ox3. VSS.Pt states had shot in his right hip for pain on Friday and when he got home and was getting out of the car, his right leg gave out and he fell to the ground. Abrasions noted on right knee/right elbow- denies any injury. States the shot has helped a little. Encouraged pt to notify ortho of what happened.  taking trulance and is having BM's daily. States no obvious bleeding for approx 3 weeks.   Denies dizziness or lighheadedness. BLE without edema.  Pt states chronic pain at a 6/10- is taking MS contin  and oxycodone .  Conitnues on torsemide 40mg  QAM. No pain at pleurx cath site. Drained for 150 cc's serosang drainage- tolerated well. Educated when to call [facility] vs PCP vs 911. Aware and in agreement with plan for next visit.     Patient continues to require the skills of a Nursing  to address the following next visit: pleurx cath management/education, med assess, cvp assess    Patient is at risk of decline in the following areas if goals not met: infection , resp compromise, GI bleed    upcoming appts:    today-  1pm [facility] palliative    Friday- GI

## 2023-10-12 NOTE — Unmapped (Signed)
 Palliative Care  [address]  [address]  [place], [place] [address]  Dept: [phone]     Chief Complaint: Exploration of Treatment Options, CLL, Prostate CA, Pain, Functional Decline, Weight loss, Depression, Anxiety, DOE, LE edema, Right Pleural Effusion, Constipation, Dysphagia        Reason for visit: Symptom Management    Place of service: Home, spouse Dagoberto present     Type of visit (new/established): Established        YEP:Tpoopjf E Dagan Heinz  83 y.o. 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 Margorie with LBP improvement but with persistent right thigh pain. Then with increasing difficulty lying flat for XRT for prostate CA and increasing pain, found more compression and underwent T10-T11 kyphoplasty/biopsy with Dr Cesar on 12/20. 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 In 01/2022 found with severe debilitating thoracolumbar pain below area of T10/T11 found no longer to be surgical candidate with recommendation to follow with palliative care for pain management while pursuing CA treatment and continued CLL surveillance. Today, patient being seen for follow up palliative care visit.      At time of this visit, patient reporting return of severe mid back pain at 7-8/10 after 5-6 hours of taking LA Morphine  30mg . He is also with return of chronic right hip pain recently seeing orthopedic who discussed recommendation for right hip surgery. Patient opted for cortisone shot to right hip earlier this week with good effect and a couple weeks ago received nerve block to mid  back with temporary relief of a week or so. He has been using Oxycodone  10mg  for breakthrough midday but does not feel as effective anymore and hopeful he can get LA managed without use of breakthrough.     Since last palliative visit, hypotension has improved with reduced dosing of Torsemide 20mg  QAM without return of LE edema. Constipation is managed with Trulance 3mg  daily and 1 senna nightly with daily BM's. He has been without melena x 3 weeks without any GI procedures planned at this time. He does have follow up with GI tomorrow for worsening swallowing concerns will pills and at times food getting stuck.     He continues with pleurx drain placement averaging drained for last 3 drains and if stays <150 than pleurx drainage visits can go to once/week.     Allergies[1]      Current Medications[2]    Review of Systems   CONSTITUTION: no fever/chills, no fatigue, +daytime drowsiness-one nap per day, decent appetite currently 2-3 meals/day, portion sizes 25-50% less compared to 6 months ago, + weight loss 190lbs a year ago, 04/2022 160lbs, 05/2022 165lbs, 150lbs 6/24, 158lbs 07/2022, 167lbs 10/2022, 160lbs 11/2022, 158lbs 05/2023, 163lbs 06/2023, 159.8lbs 08/2023  EYES: no changes in vision    ENT: no dysphagia, no odynophagia  CV: No CP, no palpitations, no dizziness, no lightheadedness, no edema -wears compression stockings  RESP: no cough, no sputum, reports no SOB at rest or with communication, no orthopnea, + DOE on moderate exertion, +pleurx catheter placement    GI: +reflux managed with omeprazole, no n/v, no constipation, no diarrhea, last  BM yesterday, no abdominal pain, no bowel incontinence  GU: no urinary incontinence, no dysuria, + urinary frequency managed with Gemtesa   NEURO:  no H/A's, no neuropathy, no asymmetrical weakness, no LE weakness   MUSCULOSKELETAL: + joint stiffness and pain in back   PSYCH/COGNITION: reports managed depression and anxiety with Duloxetine , no sleep disturbance, no STM  loss, no confusion   MOBILITY/Functional Status/ADLs: transfers and ambulates on own, cane and walking sticks for long distances, performs own ADLs with occasional supervision   DIET: occasional Boost with protein 2x/day, high protein diet    SKIN: no rash, no breakdown        Elder Abuse Screen: Negative    PH-Q2:     Not performed today       Advance Care Planning   HCP wife [name] primary, daughter [name] alternate; MOLST completed to reflect DNR/DNI wishes, copy in the home                Care team:  Delon Law, NP PCP, [facility], [place]    Dr. Raynell Mon, Oncologist, managing Prolia q64mos, Lupron injections, CLL (blood tests)  Dr. Veola, [facility], [address], [place]  Dr. Ryan Night, Radiation Oncologist, [facility]   Dr. Cesar, Neurosurgeon, [facility]  Dr. Nance, Cardiologist   Dr. Lonni Pilling, Urologist    Dr. Ronette, GI   Dr. Mitch Spark PA-C, Pulmonologist, [facility]     PE:  There were no vitals taken for this visit.   PPS Score: 50%  CONSTITUTIONAL: sitting upright in living room chair, at times shifting in seat with back pain, pleasant and engaged   HEENT: sclera anicteric, conjunctiva non-injected, no oral lesions  CV: RRR, no JVD, no edema with compression stockings in place   PULM: CTA, no accessory muscle use   GI:  +BS's, soft, NT/ND, no organomegaly    NEURO: A&Ox4, CN II-XII grossly intact, grip strength intact, UE strength 5/5, LE strength 5/5, speech fluent and clear    MUSCULOSKELETAL:  tenderness to mid thoracic back   PSYCH: mood/affect appropriate; judgement and insight intact; memory and recall of recent medical events intact    SKIN: no rash, no breakdown       LABS:  ntains abnormal data CBC and differential  Order: [id number]  Component  Ref Range & Units 1 mo ago   WBC  4.00 - 10.00 K/uL 7.6   RBC  4.50 - 6.40 M/uL 3.4 Low    HGB  13.5 - 18.0 g/dL 89.3 Low    HCT  59.9 - 54.0 % 32.7 Low     PLT  150 - 450 K/uL 157   MCV  80.0 - 100.0 fL 96.2   MCH  27.0 - 32.0 pg 31.2   MCHC  32.0 - 36.0 g/dL 67.5   RDW  88.4 - 85.4 % 12.2   MPV  8.4 - 12.0 fL 10   NRBC  0 /100 WBCs 0   ABSOLUTE NRBC  0 K/uL 0   DIFF METHOD Auto   NEUTS  48.0 - 76.0 % 67.7   LYMPHS  18.0 - 41.0 % 21.3   MONOS  4.0 - 11.0 % 10.1   EOS  0.0 - 5.0 % 0.1   BASOS  0.0 - 1.5 % 0.1   % IMMATURE GRANS  0.0 - 1.0 % 0.7   ABSOLUTE NEUTS  1.92 - 7.60 K/uL 5.14   ABSOLUTE LYMPHS  0.72 -  4.10 K/uL 1.62   ABSOLUTE MONOS  0.16 - 1.10 K/uL 0.77   ABSOLUTE EOS  0.00 - 0.50 K/uL 0.01   ABSOLUTE BASOS  0.00 - 0.15 K/uL 0.01   ABS IMMATURE GRANS  0.00 - 0.10 K/uL 0.05   Resulting Agency [facility] [facility]     Specimen Collected: 08/24/23 14:08    Performed by: [facility] Last Resulted: 08/24/23 14:12   Received From: [facility]  Result Received: 08/25/23 06:59    Received Information      IMAGING:  1 mo ago  (08/24/23) 3 mo ago  (07/13/23) 7 mo ago  (02/21/23) 8 mo ago  (02/09/23) 10 mo ago  (11/17/22) 1 yr ago  (08/22/22) 1 yr ago  (05/26/22)    Sodium (EXT) 135 Low  140 136 140 139 139 138   Potassium (EXT) 4.4 4.3 4.0 4.6 3.9 4.1 4.6   Chloride (EXT) 97 Low  103 95 Low  98 100 101 105   CO2 (EXT) 28 27 29 27 26 27 23    BUN (EXT) 32 High  20 20 27  High  19 20 20    Creatinine (EXT) 1.2 0.94 1.20 1.41 High  1.23 High  1.06 1.03   Glucose (EXT) 125 High  89 118 High  110 High  104 High  97 96   Albumin (EXT) 4.4 4.3 4.4 4.4 4.6 4.6 5.0   Protein (EXT) 6.1 Low  6.1 Low  6.3 Low  6.6 6.2 Low  6.1 Low  6.6   CalciumCalcium (EXT) 9.1 9.1 10.0 9.5 9.6 10.3 9.7   Alkaline Phosphatase (EXT) 62 67 198 High  388 High  111 64 72   Bilirubin, Total (EXT) 0.4 0.5 0.5 0.3 0.7 0.6 0.9   AST/SGOT (EXT) 12 11 12 16 16 11 13    ALT/SGPT (EXT) 6 6 6  49 High  7 8 6    Globulin (EXT) 1.7 Low  1.8 Low  1.9 Low  2.2 Low  1.6 Low  1.5 Low  1.6 Low    GFR Estimated (Calc) (EXT) 60 80 CM 60 CM 50 Low  CM 59 Low  CM 70 CM 73 CM   Comment: Estimated  glomerular filtration rate calculated using the CKD-EPI refit equation.   Anion Gap (EXT) 10 10 12 15 13 11 10    Resulting Agency [facility] [facility] [facility] [facility] [facility] [facility] [facility]             Specimen Collected: 08/24/23 14:08    Performed By: [facility] Last Resulted: 08/24/23 14:28   [facility] From: [facility]                    Assessment:   83 y.o. 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 Patient with CLL progression relapse and large right pleural effusion showing CLL cells with good response to right pleurx catheter placement and started on Calquence 100mg  BID. Symptoms of severe pain in thoracic back pain breakthrough LA midday with new recommendations below. He continues to benefit from palliative care for pain symptom management and longitudinal GOC in collaboration with providers.     THE PALLIATIVE CARE TEAM WILL CONTINUE  TO VISIT TO PROVIDE SUPPORT FOR PATIENT AND FAMILY. FOR IMMEDIATE NEEDS, PT WILL CONTACT [facility], OR 911 AS PALLIATIVE CARE DOES NOT PROVIDE EMERGENCY CARE.     PLAN:  CLL: In relapse since 2022 with progression and large pleural effusion pleural fluid showing CLL cells with pleurx catheter placed and Calquence 100mg  BID. Follows with hem/onc Dr. Kerstin.   Prostate CA: Completed XRT treatment in 02/2022. Continues on Lupron injections with Dr. Kerstin x 3 years and Casodex 50mg  daily. Followed by Dr. [facility].   Pain: Start MS Contin  TID (dosing at 30 mg QAM and QHS and 15mg  midday), Oxycodone  10mg  q6h for breakthrough and  Gabapentin  300mg  QAM. Prolia q6months for bone building with Dr. Kerstin. Keep up with bowel regime. [facility] with Dr. Veola for thoracic and lumbar cortisone shots and nerve blocks.   Functional Decline: In home PT completed, keeps up with HEP.   Weight Loss: 16% weight loss over last year recently with some fluid overload shifting. Encourage protein supplements BID and small frequent meals.   Depression with chronic pain: Continue with Duloxetine  30mg  QD.   Anxiety: Continue with Valium  2mg  QD PRN for severe anxiety or acute severe muscle spasms associated with intractable back pain. He has not required.   DOE/R Pleural Effusion: Continue with R pleurx catheter placement and draining 2x/week, Torsemide daily, Advair Diskus 1 puff 2x daily and Albuterol inhaler PRN (has not required inhalers since drain placement. Follows with Pulmonology.   LE Edema: Continue Torsemide 20mg  QAM. Wear compression stockings.   Constipation: Continue Trulance 3mg  daily and Senna 1 tab nightly  Dysphagia: GI apt tomorrow to assess next steps.     Next palliative visit in 4-6 weeks      Total time of this visit encounter: 60 minutes, with the time spent on pre-visit chart review, face to face time with the patient, discussion and counseling with the patient and family, review of the case with the care team, and documentation in the medical record .    Medication Refill    Medications refilled per patient request for Morphine  ER 15mg  midday     I am prescribing this medication for the management of symptoms related to severe back pain     Activities of Daily Living: This medication allows for symptom relief, allowing the patient to continue performing their activities of daily living without impairment.    Adverse events: The patient is not suffering from any adverse events.    Aberrant drug-taking behavior: The patient is not displaying any aberrant drug-taking behavior.      Reviewed instructions  for proper use and safe storage of medications.     MassPat reviewed.    Patient advised the NO REFILLS after hours, on Holidays or on weekends. NO EARLY REFILLS including for lost, destroyed or missing opiates or other controlled medications.      Narcan  prescription and instructions regarding proper use in an emergency previously provided to patient.             [1]   Allergies  Allergen Reactions    Penicillins Rash     Other reaction(s): Not available   [2]   Current Outpatient Medications   Medication Sig Dispense Refill    acalabrutinib (Calquence) 100 mg chemo capsule Take 100 mg by mouth twice daily Swallow whole.      acetaminophen (Tylenol) 500 mg tablet Take 500-1,000 mg by mouth every 8 (eight) hours if  needed (pain). 2 tabs BID PRN       acyclovir (Zovirax) 400 mg tablet Take 400 mg by mouth in the morning and 400 mg before bedtime.      albuterol 2.5 mg /3 mL (0.083 %) nebulizer solution Take 2.5 mg by nebulization every 4 (four) hours if needed for shortness of breath or wheezing. hold for HR greater than 120  Indications: bronchospasm prevention      alfuzosin (Uroxatral) 10 mg 24 hr tablet Take 10 mg by mouth at noon and 10 mg in the evening. Indications: enlarged prostate with urination problem      alum-mag hydroxide-simeth (Maalox Advanced) 200-200-20 mg/5 mL oral suspension Take 30 mL by mouth every 8 (eight) hours if needed for heartburn or indigestion. Indications: heartburn, indigestion      ascorbic acid (Vitamin C) 500 mg tablet Take 500 mg by mouth once daily. Indications: inadequate vitamin C      bicalutamide (Casodex) 50 mg chemo tablet Take 50 mg by mouth once daily. Indications: advanced form of prostate cancer      calcium carbonate 600 mg calcium (1,500 mg) tablet Take 1,500 mg by mouth once daily. Indications: osteoporosis, a condition of weak bones      cholecalciferol (Vitamin D-3) 25 MCG (1000 UT) capsule Take 25 mcg by mouth once daily. Indications: supplement      diazePAM   (Valium ) 2 mg tablet Take 1 tablet (2 mg) by mouth if needed each day (prior to radiation procedure). 30 tablet 0    DILTIAZEM HCL ORAL Take 120 mg by mouth 1 (one) time each day. Indications: .      docusate sodium (Colace) 100 mg tablet Take 100 mg by mouth if needed in the morning and at bedtime for constipation. Indications: constipation      DULoxetine  (Cymbalta ) 30 mg DR capsule Take 1 capsule (30 mg) by mouth once daily. Do not crush or chew. 90 capsule 1    fluticasone propion-salmeteroL (Wixela Inhub) 250-50 mcg/dose diskus inhaler Inhale 1 puff in the morning and at bedtime. Indications: bronchospasm prevention with COPD      gabapentin  (Neurontin ) 300 mg capsule Take 300 mg by mouth once daily. 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 60 tablet 0    multivitamin tablet Take 1 tablet by mouth once daily. Indications: treatment to prevent vitamin deficiency      naloxone  (Narcan ) 4 mg/0.1 mL nasal spray Administer 1 spray (4 mg) into affected nostril(s) if needed for opioid reversal. May repeat every 2-3 minutes if needed, alternating nostrils, until medical assistance becomes available. 1 each 0    omeprazole (PriLOSEC) 20 mg DR capsule Take 40 mg by mouth before breakfast. Do not crush or chew.      oxyCODONE  (Roxicodone ) 10 mg immediate release tablet Take 1 tablet (10 mg) by mouth every 6 (six) hours if needed for pain score 7-10 (severe) (pain). 90 tablet 0    plecanatide (Trulance) 3 mg tablet Take 3 mg by mouth once daily. Indications: chronic idiopathic constipation      polyethylene glycol (Glycolax) 17 gram packet Take 17 g by mouth once daily.      sennosides (senna) 8.6 mg tablet Take 1 tablet by mouth at bedtime.      torsemide (Demadex) 20 mg tablet Take 20 mg by mouth once daily. daily started on 7/3  vibegron (Gemtesa) 75 mg tablet tablet Take 150 mg by mouth once daily.       No current  facility-administered medications for this visit.

## 2023-10-16 ENCOUNTER — Encounter

## 2023-10-16 ENCOUNTER — Encounter: Admit: 2023-10-16 | Discharge: 2023-10-16 | Payer: MEDICARE

## 2023-10-16 NOTE — Home Health (Signed)
 Skilled services required this visit to address clinical and/or functional declines resulting from the following problems: 83 year old male patient living in townhouse style home with his wife admitted to [facility] for management of pleurx catheter. Patient has pmh of CLL, Prostate CA, Pain, Functional Decline, Weight loss, Depression, Anxiety, DOE, LE edema, Right Pleural Effusion    Patient progress toward goals this visit as evidenced by (compare to prior values):  A&Ox3. VSS.  taking trulance and is having BM's - denies any bleeding. Denies dizziness or lighheadedness. BLE without edema.  Pt states chronic pain at a 6/10- is taking MS contin  and oxycodone . Met with Saddie NP last week and has added MS contin  15mg  to afternoon- started yesterday- noticed a  little improvement.   Conitnues on torsemide 40mg  QAM. No pain at pleurx cath site. Drained for 125cc's serosang drainage- tolerated well. Educated when to call [facility] vs PCP vs 911. Aware and in agreement with plan for next visit.     Patient continues to require the skills of a Nursing  to address the following next visit: pleurx cath management/education, med assess, cvp assess    Patient is at risk of decline in the following areas if goals not met: infection , resp compromise, GI bleed

## 2023-10-19 ENCOUNTER — Encounter: Admit: 2023-10-19 | Discharge: 2023-10-19 | Payer: MEDICARE

## 2023-10-19 ENCOUNTER — Encounter

## 2023-10-19 NOTE — Home Health (Addendum)
 Skilled services required this visit to address clinical and/or functional declines resulting from the following problems: 83 year old male patient living in townhouse style home with his wife admitted to [facility] nursing for management of pleurx catheter. Patient has pmh of CLL, Prostate CA, Pain, Functional Decline, Weight loss, Depression, Anxiety, DOE, LE edema, Right Pleural Effusion    Patient progress toward goals this visit as evidenced by (compare to prior values):  A&Ox3. VSS. dressing change to pleurx No pain at pleurx cath site. Drained for 100cc's serosang drainage- tolerated well.  taking trulance and is having BM's - denies any bleeding. Denies dizziness or lighheadedness. Some edema noted in ankles BLE- measurements taken.   Pt states Kara from palliative care added MS contin  15mg  to afternoon- has noticed some improvements. Also had injections to back this week with some improvement. Educated when to call [facility] vs PCP vs 911. Aware and in agreement with plan for next visit.     Dr. Kerstin office aware of output from pleurx cath- office states Dr is out of the office until Sept and to continue to drain 2xweek - based on previous note- office nurse Elvie SAUNDERS,  interprets note as saying if pt in under 200cc's for the week, may decrease to once a week. She faxed over previous note- see media.Asked  Elvie to clarify once Dr. Kerstin is back in office.        Patient continues to require the skills of a Nursing  to address the following next visit: pleurx cath management/education, med assess, cvp assess    Patient is at risk of decline in the following areas if goals not met: infection , resp compromise, GI bleed

## 2023-10-23 ENCOUNTER — Encounter

## 2023-10-23 ENCOUNTER — Encounter: Admit: 2023-10-23 | Discharge: 2023-10-23 | Payer: MEDICARE

## 2023-10-23 NOTE — Home Health (Signed)
 Skilled services required this visit to address clinical and/or functional declines resulting from the following problems: 83 year old male patient living in townhouse style home with his wife admitted to [facility] for management of pleurx catheter. Patient has pmh of CLL, Prostate CA, Pain, Functional Decline, Weight loss, Depression, Anxiety, DOE, LE edema, Right Pleural Effusion    Patient progress toward goals this visit as evidenced by (compare to prior values):  A&Ox3. VSS. Pain remains  about the same or a little better with the injections. Has only had to take oxycodone  once for breakthrough pain since added the 15mg  MS contin  in afternoon.  dressing change to pleurx No pain at pleurx cath site. Drained for 125 cc's serosang drainage- tolerated well.  taking trulance and is having BM's - denies any bleeding. Denies dizziness or lighheadedness. Educated when to call [facility] vs PCP vs 911.     Spoke to [place] R at Dr. Alto office. N.O. to decrease pleurx cath drain to once per week. Visit set changed to 1xweek. Pt and wife aware  and in agreement with  plan to change to 1xw visits. Educated to reach out to [facility] if any changes in breathing.     Patient continues to require the skills of a Nursing  to address the following next visit: pleurx cath management/education, med assess, cvp assess    Patient is at risk of decline in the following areas if goals not met: infection , resp compromise, GI bleed

## 2023-10-25 MED ORDER — morphine ER (MS Contin) 30 mg 12 hr tablet
30 | ORAL_TABLET | Freq: Two times a day (BID) | ORAL | 0 refills | 28.00000 days | Status: DC
Start: 2023-10-25 — End: 2023-11-22

## 2023-10-25 NOTE — Progress Notes (Signed)
 Medication Refill    Medications refilled per patient request for Morphine  ER 30mg  q12h     I am prescribing this medication for the management of symptoms related to severe back pain     Activities of Daily Living: This medication allows for symptom relief, allowing the patient to continue performing their activities of daily living without impairment.    Adverse events: The patient is not suffering from any adverse events.    Aberrant drug-taking behavior: The patient is not displaying any aberrant drug-taking behavior.      Reviewed instructions for proper use and safe storage of medications.     MassPat reviewed.    Patient advised the NO REFILLS after hours, on Holidays or on weekends. NO EARLY REFILLS including for lost, destroyed or missing opiates or other controlled medications.      Narcan  prescription and instructions regarding proper use in an emergency previously provided to patient.

## 2023-10-26 ENCOUNTER — Encounter

## 2023-10-27 ENCOUNTER — Encounter: Admit: 2023-10-27 | Discharge: 2023-10-27 | Payer: MEDICARE

## 2023-10-27 NOTE — Home Health (Signed)
 Home Health need continues for: 82 year old male patient  admitted to [facility] nursing for management of pleurx catheter. Patient has pmh of CLL, Prostate CA, Pain, Functional Decline, Weight loss, Depression, Anxiety, DOE, LE edema, Right Pleural Effusion.     Primary diagnoses/co-morbidities/recent procedures in past 60 days that impact current episode:  Continues to need SN for pleurx cath mngt 1 x week as of last week- drainage was less than 200cc's for more than 4 visits so Dr. Kerstin ordered to decrease to 1vist x week/1 drainage x week.  BLE edema improving-  elevation/ compression stocking/diuretic education needed. No bloody stools and/ or evidence of GI bleed.  Continues to monitor weight and VS. Pt prone to orthostatic hypotension r/t dehydration. Chronic pain remains 5-6/10- MS contin   was increased to TID from BID this last cert period with  oxycodone  for breakthrough pain. Using oxycodone  less.     Current level of functional ability: Uses walker or cane to ambulate, remains weak and unsteady at times. Has chronic back pain for which he takes scheduled and PRN opiates    Homebound status and living arrangements: homebound,  living in [place] style home with his wife     Goals accomplished and/or measurable progress toward unmet goals in past 60 days: no wound care infections, no falls    Focus of care for next 60 days for each discipline ordered: prevent infection, prevent hospital stays, cvp assess d/t hypotenstion, Pleurx cath drainage and dressing change, fall prevention, BLE edema mngt    Skin integrity/wound status: Pleurx cath to right chest remains intact with sutures. Dressing change 1x week per SN. Skin around cath intact.     Code status: DNR/DNI    Most recent fall risk:5- PT has signed off    Plan of Care confirmed with Delon Burnet office. Aware of need for continued SN services.

## 2023-10-30 ENCOUNTER — Encounter: Admit: 2023-10-30 | Discharge: 2023-10-30 | Payer: MEDICARE

## 2023-10-30 NOTE — Home Health (Signed)
 Skilled services required this visit to address clinical and/or functional declines resulting from the following problems: 83 year old male patient living in townhouse style home with his wife admitted to [facility] for management of pleurx catheter. Patient has pmh of CLL, Prostate CA, Pain, Functional Decline, Weight loss, Depression, Anxiety, DOE, LE edema, Right Pleural Effusion    Patient progress toward goals this visit as evidenced by (compare to prior values):  A&Ox3. VSS. Last pleurx drain was 1 week ago. Pt denies any resp issues- has felt no change in breathing.  Dsg change completed.  No pain at pleurx cath site. Drained for 125 cc's serosang drainage- tolerated well.  taking trulance and is having BM's - denies any bleeding. Denies dizziness or lighheadedness.  Medications reviewed- denies questions. Pt concerned about results of barium swallow and GI office had to change appt until the end of september. Pt will call office tomorrow to see if sooner appt. In the meantime, GI dr encouraged him to take small bites, chew well, sit upright and chew slowly.  Educated when to call [facility] vs PCP vs 911.     Patient continues to require the skills of a Nursing  to address the following next visit: pleurx cath management/education, med assess, cvp assess    Patient is at risk of decline in the following areas if goals not met: infection , resp compromise, GI bleed

## 2023-10-31 ENCOUNTER — Encounter: Admit: 2023-10-31 | Discharge: 2023-10-31 | Payer: MEDICARE

## 2023-10-31 NOTE — Home Health (Signed)
 ST ASSESSMENT  Patient age/gender/living situation/Primary Dx for homecare/Reason for Admission: 60y M referred for dysphagia assess d/t recent aspiration event during Ba Swallow Study on 8/22 at [facility]. 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 ? Hiatal hernia as this is mentioned in hx, but not during recent imaging. Pt was to f/u w/ GI specialist on 9/12 however this was cancelled by MD and reschedule is pending. Pt active w/ [facility] since 5/3/ 2025 for management of pleurx catheter. Additional PMH includes: CLL, prostate ca,depression, anxiety, DOE, LE edema, R pleural effusion.     Current Problems requiring skilled service to be addressed by above interventions (clinical, mental, adl and functional mobility): Pt seen for ST assessment. Pt is alert, Ox3. Afebrile VSS. Endorses mild discomfort in lower esophagus. Oral mech is unremarkable. Speech is clear. Voice is WNL. Observed w/ reg/thin liquids. Prolonged oral prep, delayed AP transit and mild effortful swallow w/ trigger of pharyngeal swallow. Pt denies globus during trials but does state food/drink gets stuck occasionally (suspect mid-lower esophagus). No overt s/sx of asp observed. + belching post PO. Instructions to moist chopped solids and thin liquids, small bites and alternate liquids and solids-demo/return demo/needs reinforcement. May consider multiple small meals v 3 meals/day and/or supplement in between meals for max caloric intake d/t slow clearance through esophagus. Education to s/sx of asp to report to PCP-verbalized understanding.     Impression: Dysphagia appears to be primarily esophageal in nature at this time    No skilled ST needed.     Psychosocial and cognitive issues identified which may impact plan of care: N/A    Any additional services requested based on risk assessment? Follow up directly w/ GI for next steps/tx. ? endoscopy    Homebound Status:homebound    Patient  Identified care representative:self    Call to Delon Law NP to confirm ST assessment only. Pt needs f/u w/ GI-pending scheduling

## 2023-11-01 NOTE — Unmapped (Signed)
 I certify that this patient is confined to home and needs intermittent skilled nursing care, physical therapy and/or speech therapy or continues to need occupational therapy. The patient is under my care, and I have authorized the services on this plan of care and will periodically review the plan.

## 2023-11-06 ENCOUNTER — Encounter: Admit: 2023-11-06 | Discharge: 2023-11-06 | Payer: MEDICARE

## 2023-11-06 NOTE — Home Health (Signed)
 Skilled services required this visit to address clinical and/or functional declines resulting from the following problems: 83 year old male patient living in townhouse style home with his wife admitted to [facility] for management of pleurx catheter. Patient has pmh of CLL, Prostate CA, Pain, Functional Decline, Weight loss, Depression, Anxiety, DOE, LE edema, Right Pleural Effusion    Patient progress toward goals this visit as evidenced by (compare to prior values):  A&Ox3. VSS. Last pleurx drain was 1 week ago. Pt denies any resp issues- has felt no change in breathing.  Dsg change completed.  No pain at pleurx cath site. Drained for 75 cc's serosang drainage- tolerated well.  taking trulance and is having BM's daily - denies any bleeding. Denies dizziness or lighheadedness.  Medications reviewed- denies questions.  Educated when to call [facility] vs PCP vs 911.     Patient continues to require the skills of a Nursing  to address the following next visit: pleurx cath management/education, med assess, cvp assess    Patient is at risk of decline in the following areas if goals not met: infection , resp compromise, GI bleed                      9/18- dr kerstin  9/19- ? shot

## 2023-11-13 ENCOUNTER — Encounter: Admit: 2023-11-13 | Discharge: 2023-11-13 | Payer: MEDICARE

## 2023-11-13 NOTE — Home Health (Signed)
 Skilled services required this visit to address clinical and/or functional declines resulting from the following problems: 83 year old male patient living in townhouse style home with his wife admitted to [facility] for management of pleurx catheter. Patient has pmh of CLL, Prostate CA, Pain, Functional Decline, Weight loss, Depression, Anxiety, DOE, LE edema, Right Pleural Effusion    Patient progress toward goals this visit as evidenced by (compare to prior values):  A&Ox3. 90/60- denies dizziness or lightheadedness- encouraged hydration.  Last pleurx drain was 1 week ago. Pt denies any resp issues- has felt no change in breathing.  Dsg change completed.  No pain at pleurx cath site. Drained for 50 cc's serosang drainage- tolerated well. Pt had diarrhea this morning, but has been regular with   taking trulance and is having BM's daily - denies any bleeding.  Medications reviewed- denies questions.  Educated when to call [facility] vs PCP vs 911.     Patient continues to require the skills of a Nursing  to address the following next visit: pleurx cath management/education, med assess, cvp assess    Patient is at risk of decline in the following areas if goals not met: infection , resp compromise, GI bleed    upcoming appts:  dr cindie ordered:  endoscopy- 9/30  modified barium swallow with eating- waiting date  ENT- 9/16  9/18- dr kerstin

## 2023-11-16 LAB — CMP (EXT)
ALT/SGPT (EXT): 8 U/L (ref <42)
AST/SGOT (EXT): 11 U/L (ref <41)
Albumin (EXT): 4.1 g/dL (ref 3.5–5.2)
Alkaline Phosphatase (EXT): 85 U/L (ref 40–129)
Anion Gap (EXT): 11 mmol/L (ref 7–17)
BUN (EXT): 19 mg/dL (ref 6–23)
Bilirubin, Total (EXT): 0.7 mg/dL (ref 0.2–1.2)
CO2 (EXT): 24 mmol/L (ref 22–31)
CalciumCalcium (EXT): 8.6 mg/dL — ABNORMAL LOW (ref 8.8–10.7)
Chloride (EXT): 99 mmol/L (ref 98–107)
Creatinine (EXT): 1.2 mg/dL (ref 0.50–1.20)
GFR Estimated (Calc) (EXT): 60 mL/min/1.73m2 (ref >59)
Globulin (EXT): 2.1 g/dL — ABNORMAL LOW (ref 2.3–4.2)
Glucose (EXT): 119 mg/dL — ABNORMAL HIGH (ref 70–100)
Potassium (EXT): 4.2 mmol/L (ref 3.4–5.1)
Protein (EXT): 6.2 g/dL — ABNORMAL LOW (ref 6.4–8.3)
Sodium (EXT): 134 mmol/L — ABNORMAL LOW (ref 136–145)

## 2023-11-20 ENCOUNTER — Encounter: Admit: 2023-11-20 | Discharge: 2023-11-20 | Payer: MEDICARE

## 2023-11-20 NOTE — Home Health (Signed)
 Skilled services required this visit to address clinical and/or functional declines resulting from the following problems: 83 year old male patient living in townhouse style home with his wife admitted to [facility] for management of pleurx catheter. Patient has pmh of CLL, Prostate CA, Pain, Functional Decline, Weight loss, Depression, Anxiety, DOE, LE edema, Right Pleural Effusion    Patient progress toward goals this visit as evidenced by (compare to prior values):  A&Ox3. Has appt with Dr. Kerstin last week- ?taking out pleurx cath in approx 3 weeks. BP 80/50 sitting, 70/50 standing- denies dizziness or lightheadedness- encouraged hydration. States pain in back has been worse last couple of days, and has taken an occasional extra pain med, but has not taken any extra today. O2 sat 90-96%- denies SOB. LS clear.   Last pleurx drain was 1 week ago. Pt denies any resp issues- has felt no change in breathing.  Dsg change completed.  No pain at pleurx cath site. Drained for 150 cc's serosang drainage- tolerated well. HR 90's- occasional irregular heart beat. asymptomatic. WIfe concerned as she is having joint replacement surgery in left wrist tomorrow. Having BM's daily - denies any bleeding.  Medications reviewed- denies questions.  Educated when to call [facility] vs PCP vs 911.     Spoke to [employer] (PCP) office- all above information relayed. Office states she will pass info to [employer] nurse.     Patient continues to require the skills of a Nursing  to address the following next visit: pleurx cath management/education, med assess, cvp assess    Patient is at risk of decline in the following areas if goals not met: infection , resp compromise, GI bleed    upcoming appts:  dr cindie ordered:  endoscopy- 9/30  modified barium swallow with eating- waiting date

## 2023-11-22 MED ORDER — morphine ER (MS Contin) 30 mg 12 hr tablet
30 | ORAL_TABLET | Freq: Two times a day (BID) | ORAL | 0 refills | 28.00000 days | Status: AC
Start: 2023-11-22 — End: ?

## 2023-11-22 MED ORDER — gabapentin (Neurontin) 300 mg capsule
300 | ORAL_CAPSULE | Freq: Three times a day (TID) | ORAL | 1 refills | 30.00000 days | Status: AC
Start: 2023-11-22 — End: ?

## 2023-11-22 MED ORDER — morphine ER (MS Contin) 15 mg 12 hr tablet
15 | ORAL_TABLET | Freq: Every day | ORAL | 0 refills | 28.00000 days | Status: AC
Start: 2023-11-22 — End: 2023-12-22

## 2023-11-22 NOTE — Telephone Encounter (Addendum)
 [phone] Matthew Werner her and LVM to call  me back to set up a follow up appt for Palliative     Called me back and set up the follow up appt for 10/10 at 3pm

## 2023-11-22 NOTE — Progress Notes (Signed)
 Medication Refill    Medications refilled per patient request for Morphine  ER 30mg  BID and 15mg  daily     I am prescribing this medication for the management of symptoms related to intractable back pain     Activities of Daily Living: This medication allows for symptom relief, allowing the patient to continue performing their activities of daily living without impairment.    Adverse events: The patient is not suffering from any adverse events.    Aberrant drug-taking behavior: The patient is not displaying any aberrant drug-taking behavior.      Reviewed instructions for proper use and safe storage of medications.     [employer] reviewed.    Patient advised the NO REFILLS after hours, on Holidays or on weekends. NO EARLY REFILLS including for lost, destroyed or missing opiates or other controlled medications.      Narcan  prescription and instructions regarding proper use in an emergency previously provided to patient.

## 2023-11-27 ENCOUNTER — Encounter: Admit: 2023-11-27 | Discharge: 2023-11-27 | Payer: MEDICARE

## 2023-11-27 NOTE — Home Health (Signed)
 Skilled services required this visit to address clinical and/or functional declines resulting from the following problems: 83 year old male patient living in townhouse style home with his wife admitted to [facility] for management of pleurx catheter. Patient has pmh of CLL, Prostate CA, Pain, Functional Decline, Weight loss, Depression, Anxiety, DOE, LE edema, Right Pleural Effusion    Patient progress toward goals this visit as evidenced by (compare to prior values):  A&Ox3.  BP 84/50 sitting, 70/46 standing- asymptomatic-  denies dizziness or lightheadedness- encouraged hydration.  O2 sat 90-96%- denies SOB. LS clear.  Last pleurx drain was 1 week ago. Pt denies any resp issues- has felt no change in breathing.  Dsg change completed.  No pain at pleurx cath site. Drained for 125 cc's serosang drainage- tolerated well. HR 86-94 regular. asymptomatic. Having BM's daily - denies any bleeding.  Medications reviewed- denies questions.  Educated when to call [facility] vs PCP vs 911.     Spoke to Dr. Kerstin nurse Randine- aware of 125cc output today and 150cc output last Monday  Spoke to nurse at [facility] office- aware of BP- aware pt is asymptomatic- aware and will see pt at appt tomorrow.      Patient continues to require the skills of a Nursing  to address the following next visit: pleurx cath management/education, med assess, cvp assess    Patient is at risk of decline in the following areas if goals not met: infection , resp compromise, GI bleed    Upcoming appts:   9/30[facility] PCP  9/30- endoscopy  10/10- pleurx removal

## 2023-12-04 ENCOUNTER — Encounter: Admit: 2023-12-04 | Discharge: 2023-12-04 | Payer: MEDICARE

## 2023-12-04 NOTE — Home Health (Signed)
 Skilled services required this visit to address clinical and/or functional declines resulting from the following problems: 83 year old male patient living in townhouse style home with his wife admitted to [facility] for management of pleurx catheter. Patient has pmh of CLL, Prostate CA, Pain, Functional Decline, Weight loss, Depression, Anxiety, DOE, LE edema, Right Pleural Effusion    Patient progress toward goals this visit as evidenced by (compare to prior values):  A&Ox3. VSS-  States his PCP has him checking his BP berfore dilt/furosemide - and after, and that it does drop.denies dizziness or lightheadedness- encouraged hydration.  O2 -96%- denies SOB. LS clear.  Last pleurx drain was 1 week ago. Pt denies any resp issues- has felt no change in breathing.  Dsg change completed.  No pain at pleurx cath site. Drained for 160 cc's serosang drainage- tolerated well. HR 86-94 regular. asymptomatic. Having BM's daily - denies any bleeding.  Medications reviewed- denies questions.  Educated when to call [facility] vs PCP vs 911.     Spoke to Dr. Kerstin nurse Stephane- aware of 160cc output today- pleurx cath removal postponed at this time- she will notify pt.     Patient continues to require the skills of a Nursing  to address the following next visit: pleurx cath management/education, med assess, cvp assess    Patient is at risk of decline in the following areas if goals not met: infection , resp compromise, GI bleed    upcoming appts:  kara allen- 10/10  barrium swallow today  mose procedure on wed  pulm pat scanlon on Thurs

## 2023-12-08 ENCOUNTER — Encounter: Admit: 2023-12-08 | Payer: PRIVATE HEALTH INSURANCE | Attending: Adult Health

## 2023-12-08 DIAGNOSIS — M549 Dorsalgia, unspecified: Principal | ICD-10-CM

## 2023-12-08 NOTE — Unmapped (Signed)
 Palliative Care  [address]  [address]  [place], [place] [address]  Dept: [phone]     Chief Complaint: CLL, Prostate CA, Pain, Functional Decline, Weight loss, Depression, Anxiety, COPD, DOE, LE edema, Right Pleural Effusion, Constipation, Dysphagia     Reason for visit: Symptom Management     Place of service: Home, spouse Dagoberto present     Type of visit (new/established): Established       Matthew Werner  83 y.o. 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 Margorie with LBP improvement but with persistent right thigh pain. Then with increasing difficulty lying flat for XRT for prostate CA and increasing pain, found more compression and underwent T10-T11 kyphoplasty/biopsy with Dr Cesar on 12/20. 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 In 01/2022 found with severe debilitating thoracolumbar pain below area of T10/T11 found no longer to be surgical candidate with recommendation to follow with palliative care for pain management while pursuing CA treatment and continued CLL surveillance. Today, patient being seen for follow up palliative care visit.      Since last palliative visit, patient reviewing risks versus benefits with oncology and pulmonology of ongoing pleurx catheter placement as patient has it placed x 8 months now. Draining amounts were lessening to the point where IR was scheduled to take out catheter today, however, over the last 1-2 weeks draining amounts increasing again to >150cc. This may be related to decrease in diuretic dosing from  Torsemide 20mg  daily to Furosemide 20mg  daily which was needed last week as patient's BP was dropping down to 70's/50's.     Additionally, patient pursued endoscopy and barium swallow for swallowing concerns and had dilatation done during endoscopy with improved effect and no other swallowing concerns identified during swallow studies.     Reports chronic thoracic back pain at 2/10 at visit and managed with current regime of Morphine  ER 30mg  q12h, Gabapentin  300mg  BID-TID and adding Oxycodone  10mg  QAM if waking up with increased pain and stiffness. Additionally, he endorses improvement in pain levels in recent months attributing to more walking and strength training and stretching program he is doing with PT's called [facility].       Allergies[1]      Current Medications[2]    Review of Systems   CONSTITUTION: no fever/chills, no fatigue, +daytime drowsiness-one nap per day, decent appetite currently 2-3 meals/day, portion sizes 25-50% less compared to 6 months ago, + weight loss 190lbs a year ago, 04/2022 160lbs, 05/2022 165lbs, 150lbs 6/24, 158lbs 07/2022, 167lbs 10/2022, 160lbs 11/2022, 158lbs 05/2023, 163lbs 06/2023, 159.8lbs 08/2023, 155lbs today   EYES: no changes in vision    ENT: no dysphagia, no odynophagia  CV: No CP, no palpitations, no dizziness, no lightheadedness, no edema -wears compression stockings  RESP: no cough, no sputum, reports no SOB at rest or with communication, no orthopnea, + DOE on moderate exertion, +pleurx catheter placement    GI: +reflux managed with omeprazole, no n/v, no constipation, no diarrhea, last BM yesterday, no abdominal pain, no bowel incontinence  GU: no urinary incontinence, no dysuria, + urinary frequency managed with  Gemtesa   NEURO:  no H/A's, no neuropathy, no asymmetrical weakness, no LE weakness   MUSCULOSKELETAL: + joint stiffness and pain in back   PSYCH/COGNITION: reports managed depression and anxiety with Duloxetine , no sleep disturbance, no STM loss, no confusion    MOBILITY/Functional Status/ADLs: transfers and ambulates on own, cane and walking sticks for long distances, performs own ADLs with occasional supervision   DIET: occasional Boost with protein 2x/day, high protein diet    SKIN: no rash, no breakdown     Elder Abuse Screen: Negative    PH-Q2:    Not performed today       Advance Care Planning   HCP wife [name] primary, daughter [name] alternate; MOLST completed to reflect DNR/DNI wishes, copy in the home       Care team:  Delon Law, NP PCP, [facility], [place]    Dr. Raynell Mon, Oncologist, managing Prolia q14mos, Lupron injections, CLL (blood tests)  Dr. Veola, [facility], [address], [place]  Dr. Ryan Night, Radiation Oncologist, [facility]   Dr. Cesar, Neurosurgeon, [facility]  Dr. Nance, Cardiologist   Dr. Lonni Pilling, Urologist    Dr. Ronette, GI   Dr. Mitch Spark PA-C, Pulmonologist, [facility]     PE:  BP 106/60 (BP Location: Right arm, Patient Position: Sitting)   Pulse 73   Resp 16   Wt 70.3 kg   SpO2 96%   BMI 26.61 kg/m    PPS Score: 60%  CONSTITUTIONAL: sitting upright in living room chair, NAD, pleasant and engaged   HEENT: sclera anicteric, conjunctiva non-injected, no oral lesions  CV: RRR, no JVD, no edema with compression stockings in place   PULM: CTA, no accessory muscle use   GI:  +BS's, soft, NT/ND, no organomegaly    NEURO: A&Ox4, CN II-XII grossly intact, grip strength intact, speech fluent and clear    MUSCULOSKELETAL:  tenderness to mid thoracic back   PSYCH: mood/affect appropriate; judgement and insight intact; memory and recall of recent medical events intact    SKIN: no rash, no breakdown     LABS:  Contains abnormal data CBC and differential  Order: [id number]  Component  Ref Range & Units 3 wk ago   WBC  4.00 - 10.00 K/uL 3.39 Low    RBC  4.50 - 6.40 M/uL 3.6 Low    HGB  13.5 - 18.0 g/dL 88.5 Low    HCT  59.9 - 54.0 % 33.6 Low    PLT  150 - 450  K/uL 189   MCV  80.0 - 100.0 fL 93.3   MCH  27.0 - 32.0 pg 31.7   MCHC  32.0 - 36.0 g/dL 66.0   RDW  88.4 - 85.4 % 12.9   MPV  8.4 - 12.0 fL 9.4   NRBC  0 /100 WBCs 0   ABSOLUTE NRBC  0 K/uL 0   DIFF METHOD Auto   NEUTS  48.0 - 76.0 % 54.3   LYMPHS  18.0 - 41.0 % 28.3   MONOS  4.0 - 11.0 % 15.6 High    EOS  0.0 - 5.0 % 0.9   BASOS  0.0 - 1.5 % 0.6   % IMMATURE GRANS  0.0 - 1.0 % 0.3   ABSOLUTE NEUTS  1.92 - 7.60 K/uL 1.84 Low    ABSOLUTE LYMPHS  0.72 - 4.10 K/uL 0.96   ABSOLUTE MONOS  0.16 - 1.10 K/uL 0.53   ABSOLUTE EOS  0.00 - 0.50 K/uL 0.03   ABSOLUTE BASOS  0.00 - 0.15 K/uL 0.02   ABS IMMATURE GRANS  0.00 - 0.10 K/uL 0.01   Resulting Agency [facility]     Specimen Collected: 11/16/23 09:02     Contains abnormal data CMP (EXT)  Order: [id number]   Status: Final result              Component  Ref Range & Units (hover) 3 wk ago  (11/16/23) 3 mo ago  (08/24/23) 4 mo ago  (07/13/23) 9 mo ago  (02/21/23) 10 mo ago  (02/09/23) 1 yr ago  (11/17/22) 1 yr ago  (08/22/22)   Sodium (EXT) 134 Low  135 Low  140 136 140 139 139   Potassium (EXT) 4.2 4.4 4.3 4.0 4.6 3.9 4.1   Chloride (EXT) 99 97 Low  103 95 Low  98 100 101   CO2 (EXT) 24 28 27 29 27 26 27    BUN (EXT) 19 32 High  20 20 27  High  19 20   Creatinine (EXT) 1.2 1.2 0.94 1.20 1.41 High  1.23 High  1.06   Glucose (EXT) 119 High  125 High  89 118 High  110 High  104 High  97   Albumin (EXT) 4.1 4.4 4.3 4.4 4.4 4.6 4.6   Protein (EXT) 6.2 Low  6.1 Low  6.1 Low  6.3 Low  6.6 6.2 Low  6.1 Low    CalciumCalcium (EXT) 8.6 Low  9.1 9.1 10.0 9.5 9.6 10.3   Alkaline Phosphatase (EXT) 85 62 67 198 High  388 High  111 64   Bilirubin, Total (EXT) 0.7 0.4 0.5 0.5 0.3 0.7 0.6   AST/SGOT (EXT) 11 12 11 12 16 16 11    ALT/SGPT (EXT) 8 6 6 6  49 High  7 8   Globulin (EXT) 2.1 Low  1.7 Low  1.8 Low  1.9 Low  2.2 Low  1.6 Low  1.5 Low    GFR Estimated (Calc) (EXT) 60 60 CM 80 CM 60 CM 50 Low  CM 59 Low  CM 70 CM   Comment: Estimated glomerular filtration rate calculated using  the CKD-EPI refit equation.   Anion Gap (EXT) 11 10 10 12 15 13 11    Resulting Agency [facility] [facility] [facility] [facility] [facility] [facility] [facility] [facility]             Specimen Collected: 11/16/23 09:02         IMAGING:  None recent available        Assessment:   83 y.o. 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 Patient with CLL progression relapse and large right pleural effusion showing CLL cells in last six months requiring right pleurx catheter placement and started on Calquence 100mg  BID. Specialists discussing risks versus benefits of ongoing pleurx placement and balancing diuretics with hypotension. Persists with intractable back pain currently managed with regime in place and adding strength training, walking and PT program. He continues to benefit from palliative care for pain symptom management and longitudinal GOC in collaboration with providers.     THE PALLIATIVE CARE TEAM WILL CONTINUE TO VISIT TO PROVIDE SUPPORT FOR  PATIENT AND FAMILY. FOR IMMEDIATE NEEDS, PT WILL CONTACT [facility], OR 911 AS PALLIATIVE CARE DOES NOT PROVIDE EMERGENCY CARE.     PLAN:  CLL: In relapse since 2022 with progression and large pleural effusion pleural fluid showing CLL cells with pleurx catheter placed (04/2023) and Calquence 100mg  BID. Follows with hem/onc Dr. Kerstin.   Prostate CA: Completed XRT treatment in 02/2022. Continues on Lupron injections with Dr. Kerstin x 3 years and Casodex 50mg  daily. Followed by Dr. [facility].   Pain: Continue MS Contin  30mg  BID and 15mg  midday PRN, Oxycodone  10mg  q6h for  breakthrough and Gabapentin  300mg  BID-TID. Prolia q6months for bone building with Dr. Kerstin. Keep up with bowel regime. [facility] Dr. Veola for thoracic and lumbar cortisone shots and nerve blocks.   Functional Decline: Keeps up with HEP, walking and in home PT program [facility].   Weight Loss: 16% weight loss over last year recently with some fluid overload shifting. Encourage protein supplements BID and small frequent meals.   Depression with chronic pain: Continue with Duloxetine  30mg  QD.   Anxiety: Continue with Valium  2mg  QD PRN for severe anxiety or acute severe muscle spasms associated with intractable back pain. He has not required.   COPD/DOE: Continue Symbicort 1 puff 2x daily and Albuterol inhaler PRN. Follows with Pulmonology.   R pleural effusion/LE Edema: Will reach out to PCP and [facility] on balance of diuretic, hypotension and BP meds with providers as decreased diuretic use is correlating with increasing pleural fluid. Providers to discuss risks versus benefits of ongoing pleurx placement (currently draining at once/week). Continue Lasix 20mg  QAM. Wear compression stockings. Stable weights.   Constipation: Continue Trulance 3mg  daily and Senna 1 tab nightly  Dysphagia: Improved with esophageal dilatation. No further intervention or modifications needed at this time.     Next palliative visit in 4 weeks      Total time of this visit encounter: 60 minutes, with the time spent on pre-visit chart review, face to face time with the patient, discussion and counseling with the patient and family, review of the case with the care team, and documentation in the medical record .             [1]   Allergies  Allergen Reactions    Penicillins Rash     Other reaction(s): Not available   [2]   Current Outpatient Medications   Medication Sig Dispense Refill    acalabrutinib (Calquence) 100 mg chemo capsule Take 100 mg by mouth twice daily Swallow whole.      acetaminophen  (Tylenol) 500 mg tablet Take 500-1,000 mg by mouth every 8 (eight) hours if needed (pain). 2 tabs BID PRN       acyclovir (Zovirax) 400 mg tablet Take 400 mg by mouth in the morning and 400 mg before bedtime.      albuterol 2.5 mg /3 mL (0.083 %) nebulizer solution Take 2.5 mg by nebulization every 4 (four) hours if needed for shortness of breath or wheezing. hold for HR greater than 120  Indications: bronchospasm prevention      albuterol 90 mcg/actuation inhaler Inhale 2 puffs every 6 (six) hours if needed for wheezing.      alfuzosin (Uroxatral) 10 mg 24 hr tablet Take 10 mg by mouth at noon and 10 mg in the evening. Indications: enlarged prostate with urination problem      alum-mag hydroxide-simeth (Maalox Advanced) 200-200-20 mg/5 mL oral suspension Take  30 mL by mouth every 8 (eight) hours if needed for heartburn or indigestion. Indications: heartburn, indigestion      ascorbic acid (Vitamin C) 500 mg tablet Take 500 mg by mouth once daily. Indications: inadequate vitamin C      bicalutamide (Casodex) 50 mg chemo tablet Take 50 mg by mouth once daily. Indications: advanced form of prostate cancer      budesonide-formoteroL (Symbicort) 80-4.5 mcg/actuation inhaler Inhale 2 puffs in the morning and at bedtime. Rinse mouth with water after use to reduce aftertaste and incidence of candidiasis. Do not swallow.      calcium carbonate 600 mg calcium (1,500 mg) tablet Take 1,500 mg by mouth once daily. Indications: osteoporosis, a condition of weak bones      cholecalciferol (Vitamin D-3) 25 MCG (1000 UT) capsule Take 25 mcg by mouth once daily. Indications: supplement      DILTIAZEM HCL ORAL Take 120 mg by mouth 1 (one) time each day. Indications: .      docusate sodium (Colace) 100 mg tablet Take 100 mg by mouth if needed in the morning and at bedtime for constipation. Indications: constipation      furosemide (Lasix) 20 mg tablet Take 20 mg by mouth in the morning.      gabapentin  (Neurontin ) 300 mg capsule Take  1 capsule (300 mg) by mouth three times daily. 90 capsule 1    morphine  ER (MS Contin ) 15 mg 12 hr tablet Take 1 tablet (15 mg) by mouth once daily. Do not crush, chew, or split. To be taken midday with recommended regime of Morphine  ER 30mg  QAM, 15mg  midday and 30mg  QHS. (Patient taking differently: Take 15 mg by mouth if needed each day. Do not crush, chew, or split. To be taken midday with recommended regime of Morphine  ER 30mg  QAM, 15mg  midday and 30mg  QHS.) 30 tablet 0    morphine  ER (MS Contin ) 30 mg 12 hr tablet Take 1 tablet (30 mg) by mouth every 12 (twelve) hours. 60 tablet 0    multivitamin tablet Take 1 tablet by mouth once daily. Indications: treatment to prevent vitamin deficiency      naloxone  (Narcan ) 4 mg/0.1 mL nasal spray Administer 1 spray (4 mg) into affected nostril(s) if needed for opioid reversal. May repeat every 2-3 minutes if needed, alternating nostrils, until medical assistance becomes available. 1 each 0    omeprazole (PriLOSEC) 20 mg DR capsule Take 40 mg by mouth before breakfast. Do not crush or chew.      oxyCODONE  (Roxicodone ) 5 mg immediate release tablet Take 10 mg by mouth every 6 (six) hours if needed for pain score 7-10 (severe).      plecanatide (Trulance) 3 mg tablet Take 3 mg by mouth once daily. Indications: chronic idiopathic constipation      polyethylene glycol (Glycolax) 17 gram packet Take 17 g by mouth once daily.      sennosides (senna) 8.6 mg tablet Take 1 tablet by mouth at bedtime.      vibegron (Gemtesa) 75 mg tablet tablet Take 150 mg by mouth once daily.      diazePAM  (Valium ) 2 mg tablet Take 1 tablet (2 mg) by mouth if needed each day (prior to radiation procedure). (Patient not taking: Reported on 12/08/2023) 30 tablet 0    DULoxetine  (Cymbalta ) 30 mg DR capsule Take 1 capsule (30 mg) by mouth once daily. Do not crush or chew. 90 capsule 1    torsemide (Demadex) 20 mg tablet Take 20 mg  by mouth once daily. daily started on 7/3 (Patient not taking: Reported  on 12/08/2023)       No current facility-administered medications for this visit.

## 2023-12-11 ENCOUNTER — Encounter: Admit: 2023-12-11 | Discharge: 2023-12-11 | Payer: MEDICARE

## 2023-12-18 ENCOUNTER — Encounter: Admit: 2023-12-18 | Discharge: 2023-12-18 | Payer: MEDICARE

## 2023-12-18 NOTE — Home Health (Signed)
 Skilled services required this visit to address clinical and/or functional declines resulting from the following problems: PMH of CLL, Prostate CA, Pain, Functional Decline, Weight loss, Depression, Anxiety, DOE, Right Pleural Effusion with pleurx drain.    Patient progress toward goals this visit as evidenced by (compare to prior values):  VSS, afebrile. Reporting his usual chronic pain managed with MS contin . Wife reports increase in lasix dose from 20mg  bid to 40mg  in am and 20mg  in afternoon. Patients weight is stable at 160-162lbs, no lower extremily edema today. Pleurx drained for 175cc serosang. Called results to Dr. Kerstin per patient request. Last drainresult was 275cc. LS are clear, patient tolerated procedure well. Insertion site is well scabbed and dry, no reddness or irritation, suture is intact. Pleurx cap changed, site cleaned per [facility] policy and sterile dressing applied. Instructed on s/s infection, s/s to call MD/SN, importance of keeping dressing dry and intact, calling if dressing becomes loose or soiled. Meds reviewed and updated.     Patient continues to require the skills of a Nursing  to address the following next visit: Pleurx drainage and dressing change, assessment of pain and all systems    Patient is at risk of decline in the following areas if goals not met: Infection, hospitalization

## 2023-12-25 ENCOUNTER — Encounter: Admit: 2023-12-25 | Discharge: 2023-12-25 | Payer: MEDICARE

## 2023-12-25 NOTE — Home Health (Signed)
 Home Health need continues for: 83 year old male patient  admitted to [facility] nursing for management of pleurx catheter. Patient has pmh of CLL, Prostate CA, Pain, Functional Decline, Weight loss, Depression, Anxiety, DOE, LE edema, Right Pleural Effusion.     Primary diagnoses/co-morbidities/recent procedures in past 60 days that impact current episode:  Continues to need SN for pleurx cath mngt 1 x week. Was close to removal when drainage increased. Today was 150cc's.   BLE edema about the same-  elevation/ compression stocking/diuretic education needed. No bloody stools and/ or evidence of GI bleed.  Continues to monitor weight and VS. Pt prone to orthostatic hypotension r/t dehydration. Chronic pain remains 5-6/10- MS contin .   using heating pad and oxycodone  for breakthrough pain.     Current level of functional ability: Uses walker or cane to ambulate, remains weak and unsteady at times. Has chronic back pain for which he takes scheduled and PRN opiates    Homebound status and living arrangements: homebound,  living in [place] style home with his wife     Goals accomplished and/or measurable progress toward unmet goals in past 60 days: no wound care infections, no falls    Focus of care for next 60 days for each discipline ordered: prevent infection, prevent hospital stays, cvp assess d/t hypotenstion, Pleurx cath drainage and dressing change, fall prevention, BLE edema mngt    Skin integrity/wound status: Pleurx cath to right chest remains intact with sutures. Dressing change 1x week per SN. Skin around cath intact. Has several areas on face/head where he is undergoing MOSE procedure.     Code status: DNR/DNI    Most recent fall risk:5- PT has signed off    Plan of Care confirmed with [facility] office. Aware of need for continued SN services.

## 2023-12-28 MED ORDER — MORPHINE ER 30 MG TABLET,EXTENDED RELEASE
30 | ORAL_TABLET | Freq: Two times a day (BID) | ORAL | 0 refills | 21.00000 days | Status: DC
Start: 2023-12-28 — End: 2024-02-07

## 2023-12-28 MED ORDER — DULOXETINE 30 MG CAPSULE,DELAYED RELEASE
30 | ORAL_CAPSULE | Freq: Every day | ORAL | 1 refills | 30.00000 days | Status: AC
Start: 2023-12-28 — End: ?

## 2023-12-28 NOTE — Progress Notes (Signed)
 Medication Refill    Medications refilled per patient request for morphine  ER 30mg  q12h     I am prescribing this medication for the management of symptoms related to intractable back pain     Activities of Daily Living: This medication allows for symptom relief, allowing the patient to continue performing their activities of daily living without impairment.    Adverse events: The patient is not suffering from any adverse events.    Aberrant drug-taking behavior: The patient is not displaying any aberrant drug-taking behavior.      Reviewed instructions for proper use and safe storage of medications.     [employer] reviewed.    Patient advised the NO REFILLS after hours, on Holidays or on weekends. NO EARLY REFILLS including for lost, destroyed or missing opiates or other controlled medications.      Narcan  prescription and instructions regarding proper use in an emergency previously provided to patient.

## 2024-01-01 ENCOUNTER — Encounter: Admit: 2024-01-01 | Discharge: 2024-01-01 | Payer: MEDICARE

## 2024-01-01 NOTE — Home Health (Signed)
 Skilled services required this visit to address clinical and/or functional declines resulting from the following problems: 83 year old male patient living in townhouse style home with his wife admitted to [facility] nursing for management of pleurx catheter. Patient has pmh of CLL, Prostate CA, Pain, Functional Decline, Weight loss, Depression, Anxiety, DOE, LE edema, Right Pleural Effusion    Patient progress toward goals this visit as evidenced by (compare to prior values):  A&Ox3. VSS  O2 -97%- denies SOB. LS clear.  Last pleurx drain was 1 week ago. Pt denies any resp issues- has felt no change in breathing.  Dsg change completed.  No pain at pleurx cath site. Drained for 140 cc's serosang drainage- tolerated well. BLE measurments taken. - approx same as last visit. Dr kerstin suggested pt see cardiologist. Called cards- made appt for 11/24.Having BM's daily - denies any bleeding.  Medications reviewed- denies questions.  Educated when to call [facility] vs PCP vs 911.     Patient continues to require the skills of a Nursing  to address the following next visit: pleurx cath management/education, med assess, cvp assess    Patient is at risk of decline in the following areas if goals not met: infection , resp compromise, GI bleed

## 2024-01-04 NOTE — Home Health Plan of Care (Signed)
 Verbal SOC was obtained on 12/25/23.

## 2024-01-08 ENCOUNTER — Encounter: Admit: 2024-01-08 | Discharge: 2024-01-08 | Payer: MEDICARE

## 2024-01-08 NOTE — Home Health (Signed)
 Skilled services required this visit to address clinical and/or functional declines resulting from the following problems: 83 year old male patient living in townhouse style home with his wife admitted to [facility] nursing for management of pleurx catheter. Patient has pmh of CLL, Prostate CA, Pain, Functional Decline, Weight loss, Depression, Anxiety, DOE, LE edema, Right Pleural Effusion    Patient progress toward goals this visit as evidenced by (compare to prior values):  A&Ox3. VSS  O2 -98%- denies SOB. LS clear.  Pt denies any resp issues- has felt no change in breathing.  Dsg change completed.  No pain at pleurx cath site. Drained for 250 cc's serosang drainage- tolerated well. BLE measurments taken. - all larger than last week..Having BM's daily - denies any bleeding- sometimes constipated, sometimes loose- adjusts his senna accordingly, but takes trulance daily.  Medications reviewed- denies questions.  Educated when to call [facility] vs PCP vs 911.     Patient continues to require the skills of a Nursing  to address the following next visit: pleurx cath management/education, med assess, cvp assess    Patient is at risk of decline in the following areas if goals not met: infection , resp compromise, GI bleed

## 2024-01-08 NOTE — Home Health Plan of Care Certification Statement (Signed)
 I certify that this patient is confined to home and needs intermittent skilled nursing care, physical therapy and/or speech therapy or continues to need occupational therapy. The patient is under my care, and I have authorized the services on this plan of care and will periodically review the plan.

## 2024-01-09 NOTE — Telephone Encounter (Signed)
 [phone] Matthew Werner and scheduled a palliative follow up 11/13 at 130pm

## 2024-01-11 ENCOUNTER — Encounter: Admit: 2024-01-11 | Payer: MEDICARE | Attending: Adult Health

## 2024-01-11 DIAGNOSIS — G8929 Other chronic pain: Principal | ICD-10-CM

## 2024-01-11 MED ORDER — OXYCODONE 5 MG TABLET
5 | ORAL_TABLET | Freq: Four times a day (QID) | ORAL | 0 refills | 5.00000 days | Status: DC | PRN
Start: 2024-01-11 — End: 2024-02-01

## 2024-01-11 NOTE — Home Care Palliative Note (Signed)
 Palliative Care  [address]  [address]  [place], [place] [address]  Dept: [phone]     Chief Complaint:  CLL, Prostate CA, Pain, Functional Decline, Weight loss, Depression, COPD, DOE, LE edema, Right Pleural Effusion, Constipation, Dysphagia, Word finding difficulty      Reason for visit: GOC, Symptom Management     Place of service: Home, spouse Dagoberto present     Type of visit (new/established): Established        Matthew Werner  83 y.o. 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 Margorie with LBP improvement but with persistent right thigh pain. Then with increasing difficulty lying flat for XRT for prostate CA and increasing pain, found more compression and underwent T10-T11 kyphoplasty/biopsy with Dr Cesar on 12/20. 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 In 01/2022 found with severe debilitating thoracolumbar pain below area of T10/T11 found no longer to be surgical candidate with recommendation to follow with palliative care for pain management while pursuing CA treatment and continued CLL surveillance. Today, patient being seen for follow up palliative care visit.     Since last palliative visit, patient with increasing LE edema and increasing drainage from Pleurx catheter with 250cc serosang drainage with diuretics increased to currently at furosemide 40mg  QAM, 20mg  in the afternoon. Systolic BP's have been low 100's and 50's diastolic. He denies CP, dizziness, lightheadedness and dyspnea at rest. Anticipating seeing cardiology in the coming weeks  11/24 for edema and diuretic use and seeing hem/onc Dr. Kerstin 12/11 of ongoing pleurx placement in setting of CLL.     Reports chronic thoracic back pain managed at rest but after prolonged movement and chores outdoors at worst at 7/10. He had cortisone shot on Monday with some relief for 1-2 days but not lasting. His pain management of Morphine  ER 30mg  q12h, Gabapentin  300mg  BID and adding Oxycodone  10mg  QAM if waking up with severe pain. He has not required midday Morphine  15mg  or Gabapentin  300mg . Patient currently with overwhelming medical and personal stressors noting worsening depression and concerns with word recall and couple episodes of not remembering details of the day. Concerned morphine  maybe contributing to memory would like to optimize dosing and we also discuss adjustment in Cymbalta  which is at 30mg  daily. He had labs performed recently showing anemia with recommendation to start Vitamin B6 and low total protein otherwise wnl. He denies fever/chills, dysuria and acute mental status changes.         Allergies[1]      Current Medications[2]    Review of Systems   CONSTITUTION: no fever/chills, no fatigue, +daytime drowsiness-one nap per day, decent appetite currently 2-3 meals/day, portion sizes 25-50% less compared to 6 months ago, + weight loss 190lbs a year ago, 04/2022 160lbs, 05/2022 165lbs, 150lbs 6/24, 158lbs 07/2022, 167lbs 10/2022, 160lbs 11/2022, 158lbs 05/2023, 163lbs 06/2023, 159.8lbs 08/2023, 155lbs 11/2023   EYES: no changes in vision    ENT: no dysphagia, no odynophagia  CV: No CP, no palpitations, no dizziness, no lightheadedness, +LE edema -wears compression stockings  RESP: no cough, no sputum, reports no SOB at rest or with  communication, no orthopnea, + DOE on moderate exertion, +pleurx catheter placement    GI: +reflux managed with omeprazole, no n/v, no constipation, no diarrhea, last BM yesterday, no abdominal pain, no bowel incontinence  GU: no urinary incontinence, no dysuria, +  urinary frequency managed with Gemtesa   NEURO:  no H/A's, no neuropathy, no asymmetrical weakness, no LE weakness   MUSCULOSKELETAL: + joint stiffness and pain in thoracic and lower back   PSYCH/COGNITION: reports worsening depression and anxiety (see HPI), no sleep disturbance, +episodes of STM loss, no confusion   MOBILITY/Functional Status/ADLs: transfers and ambulates on own, cane and walking sticks for long distances, performs own ADLs with occasional supervision   DIET: occasional Boost with protein 2x/day, high protein diet    SKIN: no rash, no breakdown     Elder Abuse Screen: Negative    PH-Q2:    Not performed today     Advance Care Planning   HCP wife [name] primary, daughter [name] alternate; MOLST completed to reflect DNR/DNI wishes, copy in the home             Care team:  Delon Law, NP PCP, [facility], [place]    Dr. Raynell Mon, Oncologist, managing Prolia q72mos, Lupron injections, CLL (blood tests)  Dr. Veola, [facility], [address], [place]  Dr. Ryan Night, Radiation Oncologist, [facility]   Dr. Cesar, Neurosurgeon, [facility]  Dr. Nance, Cardiologist   Dr. Lonni Pilling, Urologist    Dr. Ronette, GI   Dr. Terrance, Pulmonologist, [facility]     PE:  There were no vitals taken for this visit.   PPS Score: 60%  CONSTITUTIONAL: sitting upright in living room chair, distressed at times  HEENT: sclera anicteric, conjunctiva non-injected, no oral lesions  CV: RRR, no JVD, +1 non pitting bilateral edema through knees with compression stockings in place   PULM: CTA, no accessory muscle use   GI:  +BS's, soft, NT/ND, no organomegaly    NEURO: A&Ox4, CN II-XII grossly intact, grip strength intact, speech fluent and clear    MUSCULOSKELETAL:  tenderness to mid thoracic back   PSYCH: mood/affect appropriate; judgement and insight intact; memory and recall of recent medical events intact    SKIN: no rash, no breakdown     LABS:  None  recent available     IMAGING:  None recent available      Assessment:   83 y.o. 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 Patient with CLL progression relapse and large right pleural effusion showing CLL cells in last six months requiring right pleurx catheter placement and started on Calquence 100mg  BID. Patient persists with pleurx placement with increasing drainage amounts and increasing LE edema while balancing diuretics with hypotension. Persists with intractable back pain with symptom management in place would like to optimize dosing with recent episodes of STM loss and word finding difficulty with additional recommendations below. Additionally, recognizing worsening reactive depression in setting of overwhelming personal and medical stressors. He continues to benefit from palliative care for pain symptom management and longitudinal GOC in collaboration with providers.     THE PALLIATIVE CARE TEAM WILL CONTINUE TO VISIT TO PROVIDE SUPPORT FOR PATIENT AND FAMILY. FOR IMMEDIATE NEEDS, PT  WILL CONTACT [facility], OR 911 AS PALLIATIVE CARE DOES NOT PROVIDE EMERGENCY CARE.     PLAN:  CLL: In relapse since 2022 with progression and large pleural effusion pleural fluid showing CLL cells with pleurx catheter placed (04/2023) and Calquence 100mg  BID. Follows with hem/onc Dr. Kerstin.   Prostate CA: Completed XRT treatment in 02/2022. Continues on Lupron injections with Dr. Kerstin x 3 years and Casodex 50mg  daily. Followed [facility]. [facility].   Pain: Decrease MS Contin  30mg  QAM and 15mg  QHS, Oxycodone  10mg  q6h for breakthrough and Gabapentin  300mg  BID. Prolia q6months for bone building with Dr. Kerstin. Keep up with bowel regime. Follows  [facility] with Dr. Veola for thoracic and lumbar cortisone shots and nerve blocks.   Functional Decline: Keeps up with HEP, walking and in home PT program [facility].   Weight Loss: 16% weight loss over last year with fluid overload shifting. Encourage protein supplements BID and small frequent meals.   Depression with chronic pain: Discussed increase Duloxetine  30mg  QD to 60mg  with worsening depression.    COPD/DOE: Continue Symbicort 1 puff 2x daily and Albuterol inhaler PRN. Follows with Pulmonology.   R pleural effusion/LE Edema: Continue Lasix 40mg  QAM and 20mg  in afternoon. Wear compression stockings. Pleurx placement. Stable weights. Will be meeting with cardiology 11/24 to optimize cardiac meds with diuretic dosing.   Constipation: Continue Trulance 3mg  daily and Senna 1 tab nightly  Dysphagia: Improved with esophageal dilatation. No further intervention or modifications needed at this time.   Word finding difficulty: Does not appear with acute mental status changes. Consider in setting of overwhelming stressors and worsening reactive depression. Will try decrease in evening morphine  to optimize dosing. Encouraged to discuss ongoing mental status changes with Dr. Kerstin in setting of CLL. Continue to monitor.     Next palliative visit in 4-6 weeks     Total time of this visit encounter: 60 minutes, with the time spent on pre-visit chart review, face to face time with the patient, discussion and counseling with the patient and family, review of the case with the care team, and documentation in the medical record .         [1]   Allergies  Allergen Reactions    Penicillins Rash     Other reaction(s): Not available   [2]   Current Outpatient Medications   Medication Sig Dispense Refill    acalabrutinib (Calquence) 100 mg chemo capsule Take 100 mg by mouth twice daily Swallow whole.      acetaminophen (Tylenol) 500 mg tablet Take 500-1,000 mg by mouth every 8 (eight) hours if  needed (pain). 2 tabs BID PRN       acyclovir (Zovirax) 400 mg tablet Take 400 mg by mouth in the morning and 400 mg before bedtime.      albuterol 2.5 mg /3 mL (0.083 %) nebulizer solution Take 2.5 mg by nebulization every 4 (four) hours if needed for shortness of breath or wheezing. hold for HR greater than 120  Indications: bronchospasm prevention      albuterol 90 mcg/actuation inhaler Inhale 2 puffs every 6 (six) hours if needed for wheezing.      alfuzosin (Uroxatral) 10 mg 24 hr tablet Take 10 mg by mouth at noon and 10 mg in the evening. Indications: enlarged prostate with urination problem      alum-mag hydroxide-simeth (Maalox Advanced) 200-200-20 mg/5 mL oral suspension Take 30 mL by mouth every 8 (eight) hours  if needed for heartburn or indigestion. Indications: heartburn, indigestion      ascorbic acid (Vitamin C) 500 mg tablet Take 500 mg by mouth once daily. Indications: inadequate vitamin C      bicalutamide (Casodex) 50 mg chemo tablet Take 50 mg by mouth once daily. Indications: advanced form of prostate cancer      budesonide-formoteroL (Symbicort) 80-4.5 mcg/actuation inhaler Inhale 2 puffs in the morning and at bedtime. Rinse mouth with water after use to reduce aftertaste and incidence of candidiasis. Do not swallow.      calcium carbonate 600 mg calcium (1,500 mg) tablet Take 1,500 mg by mouth once daily. Indications: osteoporosis, a condition of weak bones      cholecalciferol (Vitamin D-3) 25 MCG (1000 UT) capsule Take 25 mcg by mouth once daily. Indications: supplement      diazePAM  (Valium ) 2 mg tablet Take 1 tablet (2 mg) by mouth if needed each day (prior to radiation procedure). (Patient not taking: Reported on 12/08/2023) 30 tablet 0    DILTIAZEM HCL ORAL Take 120 mg by mouth 1 (one) time each day. Indications: .      docusate sodium (Colace) 100 mg tablet Take 100 mg by mouth if needed in the morning and at bedtime for constipation. Indications: constipation      DULoxetine  (Cymbalta )  30 mg DR capsule Take 1 capsule (30 mg) by mouth once daily. Do not crush or chew. 90 capsule 1    furosemide (Lasix) 20 mg tablet Take 20 mg by mouth in the morning.      furosemide (Lasix) 20 mg tablet Take 20 mg by mouth once daily. later afternoon  Indications: high blood pressure      furosemide (Lasix) 40 mg tablet Take 40 mg by mouth once daily. am dose, see afternoon dose  Indications: high blood pressure      gabapentin  (Neurontin ) 300 mg capsule Take 1 capsule (300 mg) by mouth three times daily. 90 capsule 1    morphine  ER (MS Contin ) 15 mg 12 hr tablet Take 1 tablet (15 mg) by mouth once daily. Do not crush, chew, or split. To be taken midday with recommended regime of Morphine  ER 30mg  QAM, 15mg  midday and 30mg  QHS. (Patient taking differently: Take 15 mg by mouth if needed each day. Do not crush, chew, or split. To be taken midday with recommended regime of Morphine  ER 30mg  QAM, 15mg  midday and 30mg  QHS.) 30 tablet 0    morphine  ER (MS Contin ) 30 mg 12 hr tablet Take 1 tablet (30 mg) by mouth every 12 (twelve) hours. 60 tablet 0    multivitamin tablet Take 1 tablet by mouth once daily. Indications: treatment to prevent vitamin deficiency      naloxone  (Narcan ) 4 mg/0.1 mL nasal spray Administer 1 spray (4 mg) into affected nostril(s) if needed for opioid reversal. May repeat every 2-3 minutes if needed, alternating nostrils, until medical assistance becomes available. 1 each 0    omeprazole (PriLOSEC) 20 mg DR capsule Take 40 mg by mouth before breakfast. Do not crush or chew.      oxyCODONE  (Roxicodone ) 5 mg immediate release tablet Take 10 mg by mouth every 6 (six) hours if needed for pain score 7-10 (severe).      plecanatide (Trulance) 3 mg tablet Take 3 mg by mouth once daily. Indications: chronic idiopathic constipation      polyethylene glycol (Glycolax) 17 gram packet Take 17 g by mouth once daily.  sennosides (senna) 8.6 mg tablet Take 1 tablet by mouth at bedtime.      torsemide (Demadex)  20 mg tablet Take 20 mg by mouth once daily. daily started on 7/3 (Patient not taking: Reported on 12/08/2023)      vibegron (Gemtesa) 75 mg tablet tablet Take 150 mg by mouth once daily.       No current facility-administered medications for this visit.

## 2024-01-15 ENCOUNTER — Encounter: Admit: 2024-01-15 | Discharge: 2024-01-15 | Payer: MEDICARE

## 2024-01-15 NOTE — Home Health (Signed)
 Skilled services required this visit to address clinical and/or functional declines resulting from the following problems:PROSTATE CA/PLEURAL EFFUSION/PLEURX DRAIN    Patient progress toward goals this visit as evidenced by (compare to prior values):  PT 155LB. PLEURX DRAIN OF SEROSANGUINOUS OF FLUIDS. PLEURX SITE W/H SUTURES PATENT/PINK/NO INFECTION. BLE EDEMA +1 TO CALF/+2 TO INSTEP AND ANKLE W/H MEASUREMENT TAKEN. PT WEAR COMPRESSIONS SOCKS AND AWARE TO ELEVATE BLE. LSC AND NO SOB PRIOR/AFTER DRAIN. VSS STABLE PRIOR/AFTER DRAIN. PT REPORTED SOMETIMES CONSTIPATION/ OR DIARRHEA. PT REPORTED NO PAIN BUT MILD DISCOMFORT R SIDE BUTTOCKS.  HE MANAGE MEDS/REST. HE ADDED HE FELL TWO DAYS AGO AND NO FALL. HE DID NOT GO TO ER. THIS PT TAUGHT IF ANY CHANGE INCREASED PAIN/UNABLE TO AMB TO ALERT MD/VNA/OR GO ER FOR FURTHER EVALUATION. HE UNDERSTOOD/AGREEMENT W/H TEACHING. HE SEES DERMATOLOGIST THIS WEEK TO REMOVED SUTURES TOP HEAD /ANOTHER MOLE.    Patient continues to require the skills of a NURSING to address the following next visit: PLEURAL EFFUSION/PLEURX DRAIN/MD APPOINT F/U  Patient is at risk of decline in the following areas if goals not met: INFECTION/HOSPITALIZATION

## 2024-01-22 ENCOUNTER — Encounter: Admit: 2024-01-22 | Discharge: 2024-01-22 | Payer: MEDICARE

## 2024-01-22 NOTE — Home Health (Signed)
 Skilled services required this visit to address clinical and/or functional declines resulting from the following problems: 83 year old male patient living in townhouse style home with his wife admitted to [facility] for management of pleurx catheter. Patient has pmh of CLL, Prostate CA, Pain, Functional Decline, Weight loss, Depression, Anxiety, DOE, LE edema, Right Pleural Effusion    Patient progress toward goals this visit as evidenced by (compare to prior values):  A&Ox3. VSS  O2 -96%- denies SOB. LS clear.  Pt denies any resp issues- has felt no change in breathing.  Dsg change completed.  No pain at pleurx cath site. Drained for 150 cc's serosang drainage- tolerated well. BLE measurments taken. - all sl  larger than last week..Having BM's daily - denies any bleeding. Is taking trulance daily.  Medications reviewed- denies questions- evening pain med decreased by Saddie Dawn- med list updated to reflect change.  Educated when to call [facility] vs PCP vs 911.     Patient continues to require the skills of a Nursing  to address the following next visit: pleurx cath management/education, med assess, cvp assess    Patient is at risk of decline in the following areas if goals not met: infection , resp compromise, GI bleed                            dr deborha cards- today  11/25-Mariggi- pain mngt- shot in back  11/25- dermatologist  dr kerstin next week  12/2- PCP

## 2024-01-29 ENCOUNTER — Encounter: Admit: 2024-01-29 | Discharge: 2024-01-29 | Payer: MEDICARE

## 2024-01-29 NOTE — Home Health (Signed)
 Skilled services required this visit to address clinical and/or functional declines resulting from the following problems: PLEURAL EFFUSION/PLEURX DRAIN    Patient progress toward goals this visit as evidenced by (compare to prior values):  NO PAIN AND DISCOMFORT. HIS WT 165LB. LSC AND NO SOB PRE/PSOT PLEURX DRAIN. PLEURX SITE CDI/PINK AND NO INFECTION. PLEURX DRAIN SEROSANGUINOUS. GI/GU WNL. NO BLE EDEMA. VS PRE/POST DRAIN STABLE AND NO DRASTIC CHANGES. TOMORROW APPOINT TO REMOVE STITCHES ON HEAD.    Patient continues to require the skills of a NURSING  to address the following next visit: PLEURAL EFFUSION/PLEURX DRAIN  Patient is at risk of decline in the following areas if goals not met: HOSPITALIZATION

## 2024-02-01 MED ORDER — MORPHINE ER 15 MG TABLET,EXTENDED RELEASE
15 | ORAL_TABLET | Freq: Every day | ORAL | 0 refills | 3.00000 days | Status: DC
Start: 2024-02-01 — End: 2024-03-06

## 2024-02-01 MED ORDER — OXYCODONE 10 MG TABLET
10 | ORAL_TABLET | Freq: Four times a day (QID) | ORAL | 0 refills | 22.00000 days | Status: DC | PRN
Start: 2024-02-01 — End: 2024-03-18

## 2024-02-01 NOTE — Progress Notes (Signed)
 Medication Refill    Medications refilled per patient request for Morphine  ER and Oxycodone      I am prescribing this medication for the management of symptoms related to intractable back pain     Activities of Daily Living: This medication allows for symptom relief, allowing the patient to continue performing their activities of daily living without impairment.    Adverse events: The patient is not suffering from any adverse events.    Aberrant drug-taking behavior: The patient is not displaying any aberrant drug-taking behavior.      Reviewed instructions for proper use and safe storage of medications.     [employer] reviewed.    Patient advised the NO REFILLS after hours, on Holidays or on weekends. NO EARLY REFILLS including for lost, destroyed or missing opiates or other controlled medications.      Narcan  prescription and instructions regarding proper use in an emergency previously provided to patient.

## 2024-02-05 ENCOUNTER — Encounter: Admit: 2024-02-05 | Discharge: 2024-02-05 | Payer: MEDICARE

## 2024-02-05 NOTE — Home Health (Signed)
 Skilled services required this visit to address clinical and/or functional declines resulting from the following problems: 83 year old male patient living in townhouse style home with his wife admitted to [facility] for management of pleurx catheter. Patient has pmh of CLL, Prostate CA, Pain, Functional Decline, Weight loss, Depression, Anxiety, DOE, LE edema, Right Pleural Effusion    Patient progress toward goals this visit as evidenced by (compare to prior values):  A&Ox3. VSS  O2 -96%- denies SOB. LS clear.  Pt denies any resp issues- has felt no change in breathing.  Dsg change completed.  No pain at pleurx cath site. Drained for 100 cc's serosang drainage- having twinges requiring nurse to slow/stop/then restart drainage serveral times. SABRA BLE measurments taken. - all smaller..Having BM's daily - denies any bleeding. Is taking trulance daily. Had cardiology appt- dc'd dilt- tapered for one week and then dc'd. Had appt with PCP last week amd has a f/u this week.  Medications reviewed- denies questions.  Educated when to call [facility] vs PCP vs 911.     Patient continues to require the skills of a Nursing  to address the following next visit: pleurx cath management/education, med assess, cvp assess    Patient is at risk of decline in the following areas if goals not met: infection , resp compromise, GI bleed    Upcoming appts:  oncologist 12/11  pcp 12/11

## 2024-02-07 MED ORDER — MORPHINE ER 30 MG TABLET,EXTENDED RELEASE
30 | ORAL_TABLET | Freq: Two times a day (BID) | ORAL | 0 refills | 21.00000 days | Status: AC
Start: 2024-02-07 — End: ?

## 2024-02-07 NOTE — Progress Notes (Signed)
 Medication Refill    Medications refilled per patient request for Morphine  ER 30mg  q12h     I am prescribing this medication for the management of symptoms related to severe back pain     Activities of Daily Living: This medication allows for symptom relief, allowing the patient to continue performing their activities of daily living without impairment.    Adverse events: The patient is not suffering from any adverse events.    Aberrant drug-taking behavior: The patient is not displaying any aberrant drug-taking behavior.      Reviewed instructions for proper use and safe storage of medications.     MassPat reviewed.    Patient advised the NO REFILLS after hours, on Holidays or on weekends. NO EARLY REFILLS including for lost, destroyed or missing opiates or other controlled medications.      Narcan  prescription and instructions regarding proper use in an emergency previously provided to patient.

## 2024-02-12 ENCOUNTER — Encounter: Admit: 2024-02-12 | Discharge: 2024-02-12 | Payer: MEDICARE

## 2024-02-12 NOTE — Home Health (Signed)
 Skilled services required this visit to address clinical and/or functional declines resulting from the following problems: 83 year old male patient living in townhouse style home with his wife admitted to [facility] for management of pleurx catheter. Patient has pmh of CLL, Prostate CA, Pain, Functional Decline, Weight loss, Depression, Anxiety, DOE, LE edema, Right Pleural Effusion    Patient progress toward goals this visit as evidenced by (compare to prior values):  A&Ox3. VSS  O2 -96%- denies SOB. LS clear.  PleurX cath drained for 150cc's sero/sang fluid.  Dsg change completed.  No pain at pleurx cath site.Having BM's daily - denies any bleeding. continues on  trulance daily.   Medications reviewed- denies questions.Dilt has remained dc'd and lasix dc'd- torsemide started 20mg  daily.Pt appears anxious today, losing train of thought, repeating things, moving fast saying  I don't even know what I'm doing.  Granddaughter feels his depression is worse and that he cries most mornings. Email sent to Saddie Dawn NP to update.  Educated when to call [facility] vs PCP vs 911.     Patient continues to require the skills of a Nursing  to address the following next visit: pleurx cath management/education, med assess, cvp assess    Patient is at risk of decline in the following areas if goals not met: infection , resp compromise, GI bleed

## 2024-02-19 ENCOUNTER — Encounter: Admit: 2024-02-19 | Discharge: 2024-02-19 | Payer: MEDICARE

## 2024-02-19 MED ORDER — GABAPENTIN 300 MG CAPSULE
300 | ORAL_CAPSULE | Freq: Three times a day (TID) | ORAL | 1 refills | 30.00000 days | Status: AC
Start: 2024-02-19 — End: ?

## 2024-02-19 NOTE — Home Health (Signed)
 Skilled services required this visit to address clinical and/or functional declines resulting from the following problems: PLEURAL EFFUSION/PLEURX    Patient progress toward goals this visit as evidenced by (compare to prior values): PLEURX DRAIN 125CC SEROSANGUINOUS. PLEURX SITE CDI/PINK AND NO INFECTION. LSC AND NO SOB. PRE/POST DRAIN VSS. GI/GU WNL.PT REPORTED NO LONGER LASIX BUT TORSEMIDE DAILY 20MG . NO BLE EDEMA NOTED.    Patient continues to require the skills of a NURSING to address the following next visit: PLEURAL EFFUSION/PLEURX  Patient is at risk of decline in the following areas if goals not met: INFECTION/HOSPITALIZATION

## 2024-02-23 ENCOUNTER — Encounter: Admit: 2024-02-23 | Discharge: 2024-02-23

## 2024-02-23 NOTE — Home Health (Signed)
 Home Health need continues for: 83 year old male patient  admitted to [facility] nursing for management of pleurx catheter. Patient has pmh of CLL, Prostate CA, Pain, Functional Decline, Weight loss, Depression, Anxiety, DOE, LE edema, Right Pleural Effusion.     Primary diagnoses/co-morbidities/recent procedures in past 60 days that impact current episode:  Continues to need SN for pleurx cath mngt 1 x week.   BLE edema about the same-  elevation/ compression stocking/diuretic education needed. No bloody stools and/ or evidence of GI bleed.  Continues to monitor weight and VS. Pt prone to orthostatic hypotension r/t dehydration. Chronic pain remains 5-6/10- MS contin .   using heating pad and oxycodone  for breakthrough pain.     Current level of functional ability: Uses walker or cane to ambulate, remains weak and unsteady at times. Has chronic back pain for which he takes scheduled and PRN opiates    Homebound status and living arrangements: homebound,  living in [place] style home with his wife     Goals accomplished and/or measurable progress toward unmet goals in past 60 days: no wound  infections, no falls    Focus of care for next 60 days for each discipline ordered: prevent infection, prevent hospital stays, cvp assess d/t hypotenstion, Pleurx cath drainage and dressing change, fall prevention, BLE edema mngt    Skin integrity/wound status: Pleurx cath to right chest remains intact with sutures. Dressing change 1x week per SN. Skin around cath intact.     Code status: DNR/DNI    Most recent fall risk:6- PT has signed off    Plan of Care confirmed with [facility] Burnet office. Aware of need for continued SN services.

## 2024-02-26 ENCOUNTER — Encounter: Admit: 2024-02-26 | Discharge: 2024-02-26 | Payer: MEDICARE

## 2024-02-26 NOTE — Home Health (Signed)
 Skilled services required this visit to address clinical and/or functional declines resulting from the following problems: 83 year old male patient living in townhouse style home with his wife admitted to [facility] for management of pleurx catheter. Patient has pmh of CLL, Prostate CA, Pain, Functional Decline, Weight loss, Depression, Anxiety, DOE, LE edema, Right Pleural Effusion    Patient progress toward goals this visit as evidenced by (compare to prior values):  A&Ox3. VSS  O2 -96%- denies SOB. LS clear.  PleurX cath drained for 125cc's sero/sang fluid.  Dsg change completed.  No pain at pleurx cath site.Having BM's daily - denies any bleeding. continues on  trulance daily. Pt less anxious today, states daughter is making improvements.   Meds reviewed- denies questions- states he hasn't taken the mid day oxycodone  because most of the tinme if he sits and applies a heating pad, he feels much better. Educated when to call [facility] vs PCP vs 911.     Patient continues to require the skills of a Nursing  to address the following next visit: pleurx cath management/education, med assess, cvp assess    Patient is at risk of decline in the following areas if goals not met: infection , resp compromise, GI bleed

## 2024-03-04 ENCOUNTER — Encounter: Admit: 2024-03-04 | Discharge: 2024-03-04 | Payer: MEDICARE

## 2024-03-04 NOTE — Home Health (Signed)
 Skilled services required this visit to address clinical and/or functional declines resulting from the following problems: 84 year old male patient living in townhouse style home with his wife admitted to [facility] for management of pleurx catheter. Patient has pmh of CLL, Prostate CA, Pain, Functional Decline, Weight loss, Depression, Anxiety, DOE, LE edema, Right Pleural Effusion    Patient progress toward goals this visit as evidenced by (compare to prior values):  A&Ox3. VSS  O2 -97%- denies SOB. LS clear.  PleurX cath drained for 340 cc's sero/sang fluid.  Dsg change completed. insertion site noted to be pink/red- no pain no drainage.Having BM's daily - denies any bleeding. continues on  trulance daily.  Meds reviewed- denies questions- states he has only taken the midday pain med once this week. C/o legs being sore. States he has been doing more time on the stationary bike, but that his legs were sore before that. Drinking electrolytes/water everyday.  Educated when to call [facility] vs PCP vs 911.     Call to Dr, Kerstin office- Left message with office statng drg amount and redness at insertion site.    Patient continues to require the skills of a Nursing  to address the following next visit: pleurx cath management/education, med assess, cvp assess    Patient is at risk of decline in the following areas if goals not met: infection , resp compromise, GI bleed

## 2024-03-05 NOTE — Home Health Plan of Care (Signed)
 Verbal SOC was obtained on 02/23/24.

## 2024-03-06 MED ORDER — MORPHINE ER 15 MG TABLET,EXTENDED RELEASE
15 | ORAL_TABLET | Freq: Every day | ORAL | 0 refills | 7.00000 days | Status: DC
Start: 2024-03-06 — End: 2024-04-03

## 2024-03-06 NOTE — Home Health Plan of Care Certification Statement (Signed)
 I certify that this patient is confined to home and needs intermittent skilled nursing care, physical therapy and/or speech therapy or continues to need occupational therapy. The patient is under my care, and I have authorized the services on this plan of care and will periodically review the plan.

## 2024-03-06 NOTE — Progress Notes (Signed)
 Medication Refill    Medications refilled per patient request for Morphine  ER 15mg  daily     I am prescribing this medication for the management of symptoms related to severe back pain     Activities of Daily Living: This medication allows for symptom relief, allowing the patient to continue performing their activities of daily living without impairment.    Adverse events: The patient is not suffering from any adverse events.    Aberrant drug-taking behavior: The patient is not displaying any aberrant drug-taking behavior.      Reviewed instructions for proper use and safe storage of medications.     [employer] reviewed.    Patient advised the NO REFILLS after hours, on Holidays or on weekends. NO EARLY REFILLS including for lost, destroyed or missing opiates or other controlled medications.      Narcan  prescription and instructions regarding proper use in an emergency previously provided to patient.

## 2024-03-11 ENCOUNTER — Encounter: Admit: 2024-03-11 | Discharge: 2024-03-11 | Payer: MEDICARE

## 2024-03-11 NOTE — Home Health (Signed)
 Skilled services required this visit to address clinical and/or functional declines resulting from the following problems: 84 year old male patient living in townhouse style home with his wife admitted to [facility] for management of pleurx catheter. Patient has pmh of CLL, Prostate CA, Pain, Functional Decline, Weight loss, Depression, Anxiety, DOE, LE edema, Right Pleural Effusion    Patient progress toward goals this visit as evidenced by (compare to prior values):  A&Ox3. VSS  O2 -95%- denies SOB. LS clear.  PleurX cath drained for 225 cc's sero/sang fluid.  Dsg change completed. insertion site noted to be pink and crusty- no pain no drainage.Having BM's daily - denies any bleeding. continues on  trulance daily.  Meds reviewed- denies questions- . Able to walk outside a little over the weekend. Feels very tired today.  Drinking electrolytes/water everyday.  Educated when to call [facility] vs PCP vs 911.     Patient continues to require the skills of a Nursing  to address the following next visit: pleurx cath management/education, med assess, cvp assess    Patient is at risk of decline in the following areas if goals not met: infection , resp compromise, GI bleed

## 2024-03-18 ENCOUNTER — Encounter: Admit: 2024-03-18 | Discharge: 2024-03-18 | Payer: MEDICARE

## 2024-03-18 MED ORDER — OXYCODONE 10 MG TABLET
10 | ORAL_TABLET | Freq: Four times a day (QID) | ORAL | 0 refills | 22.00000 days | Status: AC | PRN
Start: 2024-03-18 — End: ?

## 2024-03-18 NOTE — Home Health (Addendum)
 Skilled services required this visit to address clinical and/or functional declines resulting from the following problems: 84 year old male patient living in townhouse style home with his wife admitted to [facility] for management of pleurx catheter. Patient has pmh of CLL, Prostate CA, Pain, Functional Decline, Weight loss, Depression, Anxiety, DOE, LE edema, Right Pleural Effusion    Patient progress toward goals this visit as evidenced by (compare to prior values):  A&Ox3. VSS  O2 -97%- denies SOB. LS clear.  PleurX cath drained for 210 cc's sero/sang fluid.  Dsg change completed. insertion site continues  to be pink and crusty- no pain no drainage. Pic taken and uploaded to chat. Pain in left leg outter aspect mostly lower leg,but does radiate up. His daily narcotics Don't touch it and neither does and additional midday pain med. Rest alleviates pain. Was seen at PCP office- no change in treatment. Having BM's daily - denies any bleeding. continues on  trulance daily.  Meds reviewed- denies questions. drinking electrolytes/water everyday.  Educated when to call [facility] vs PCP vs 911.     Attemted to call Dr. Kerstin office re: PleurX cath site- office closed d/t holiday    Patient continues to require the skills of a Nursing  to address the following next visit: pleurx cath management/education, med assess, cvp assess    Patient is at risk of decline in the following areas if goals not met: infection , resp compromise, GI bleed

## 2024-03-18 NOTE — Progress Notes (Signed)
 Medication Refill    Medications refilled per patient request for Oxycodone      I am prescribing this medication for the management of symptoms related to severe back pain     Activities of Daily Living: This medication allows for symptom relief, allowing the patient to continue performing their activities of daily living without impairment.    Adverse events: The patient is not suffering from any adverse events.    Aberrant drug-taking behavior: The patient is not displaying any aberrant drug-taking behavior.      Reviewed instructions for proper use and safe storage of medications.     [employer] reviewed.    Patient advised the NO REFILLS after hours, on Holidays or on weekends. NO EARLY REFILLS including for lost, destroyed or missing opiates or other controlled medications.      Narcan  prescription and instructions regarding proper use in an emergency previously provided to patient.

## 2024-03-25 ENCOUNTER — Encounter

## 2024-03-26 ENCOUNTER — Encounter: Admit: 2024-03-26 | Discharge: 2024-03-26 | Payer: MEDICARE

## 2024-03-26 NOTE — Home Health (Signed)
 Skilled services required this visit to address clinical and/or functional declines resulting from the following problems: 84 year old male patient living in townhouse style home with his wife admitted to [facility] for management of pleurx catheter. Patient has pmh of CLL, Prostate CA, Pain, Functional Decline, Weight loss, Depression, Anxiety, DOE, LE edema, Right Pleural Effusion    Patient progress toward goals this visit as evidenced by (compare to prior values):  A&Ox3. VSS  O2 -97%- denies SOB. LS clear.  PleurX cath drained for 200 cc's sero/sang fluid.  Dsg change completed. insertion site continues  to be pink/red and crusty/scabbed area came off with cleansing/wiping. Scant amt of bleeding. Dsg changed- pt aware to call MD if increased pain or fever/chills. Having BM's daily - denies any bleeding. continues on  trulance daily.  Meds reviewed- denies questions. drinking electrolytes/water everyday.  Educated when to call [facility] vs PCP vs 911.     Patient continues to require the skills of a Nursing  to address the following next visit: pleurx cath management/education, med assess, cvp assess    Patient is at risk of decline in the following areas if goals not met: infection , resp compromise, GI bleed

## 2024-04-01 ENCOUNTER — Encounter: Admit: 2024-04-01 | Discharge: 2024-04-01 | Payer: MEDICARE

## 2024-04-01 NOTE — Home Health (Signed)
 Skilled services required this visit to address clinical and/or functional declines resulting from the following problems: 84 year old male patient living in townhouse style home with his wife admitted to [facility] for management of pleurx catheter. Patient has pmh of CLL, Prostate CA, Pain, Functional Decline, Weight loss, Depression, Anxiety, DOE, LE edema, Right Pleural Effusion    Patient progress toward goals this visit as evidenced by (compare to prior values):  A&Ox3. VSS  O2 -97%- denies SOB. LS clear. States pain in back has been pretty bad and thinks its related to increased exercises that he did. rest /heat with good effect.  PleurX cath drained for 150 cc's sero/sang fluid.  Dsg change completed. insertion site less pink/red.  No drg. Dsg changed- pt aware to call MD if increased pain or fever/chills. Having BM's daily - denies any bleeding. continues on  trulance daily.  Meds reviewed- denies questions. drinking electrolytes/water everyday.  Educated when to call [facility] vs PCP vs 911.     Patient continues to require the skills of a Nursing  to address the following next visit: pleurx cath management/education, med assess, cvp assess    Patient is at risk of decline in the following areas if goals not met: infection , resp compromise, GI bleed

## 2024-04-03 MED ORDER — MORPHINE ER 15 MG TABLET,EXTENDED RELEASE
15 | ORAL_TABLET | Freq: Every day | ORAL | 0 refills | 7.00000 days | Status: AC
Start: 2024-04-03 — End: ?

## 2024-04-03 NOTE — Progress Notes (Signed)
 Medication Refill    Medications refilled per patient request for Morphine  ER 15mg  daily     I am prescribing this medication for the management of symptoms related to severe back pain     Activities of Daily Living: This medication allows for symptom relief, allowing the patient to continue performing their activities of daily living without impairment.    Adverse events: The patient is not suffering from any adverse events.    Aberrant drug-taking behavior: The patient is not displaying any aberrant drug-taking behavior.      Reviewed instructions for proper use and safe storage of medications.     [employer] reviewed.    Patient advised the NO REFILLS after hours, on Holidays or on weekends. NO EARLY REFILLS including for lost, destroyed or missing opiates or other controlled medications.      Narcan  prescription and instructions regarding proper use in an emergency previously provided to patient.

## 2024-04-08 ENCOUNTER — Encounter

## 2024-04-09 ENCOUNTER — Encounter
